# Patient Record
Sex: Male | Born: 1975 | Race: White | Hispanic: Yes | Marital: Married | Smoking: Never smoker
Health system: Southern US, Community
[De-identification: ages and names within clinical notes are randomized; demographics above are authoritative.]

## PROBLEM LIST (undated history)

## (undated) DIAGNOSIS — E785 Hyperlipidemia, unspecified: Secondary | ICD-10-CM

---

## 1999-02-06 ENCOUNTER — Encounter: Payer: Self-pay | Admitting: Emergency Medicine

## 1999-02-06 ENCOUNTER — Emergency Department (HOSPITAL_COMMUNITY): Admission: EM | Admit: 1999-02-06 | Discharge: 1999-02-06 | Payer: Self-pay | Admitting: Emergency Medicine

## 2005-02-03 ENCOUNTER — Emergency Department (HOSPITAL_COMMUNITY): Admission: EM | Admit: 2005-02-03 | Discharge: 2005-02-03 | Payer: Self-pay | Admitting: Family Medicine

## 2010-09-04 ENCOUNTER — Ambulatory Visit: Payer: Self-pay | Admitting: Internal Medicine

## 2011-09-24 ENCOUNTER — Emergency Department (HOSPITAL_COMMUNITY)
Admission: EM | Admit: 2011-09-24 | Discharge: 2011-09-24 | Disposition: A | Discharge status: Home / Self Care | Payer: Self-pay | Source: Home / Self Care

## 2011-09-24 DIAGNOSIS — B356 Tinea cruris: Secondary | ICD-10-CM

## 2011-09-24 MED ORDER — NYSTATIN-TRIAMCINOLONE 100000-0.1 UNIT/GM-% EX CREA: TOPICAL_CREAM | CUTANEOUS | Status: AC

## 2011-09-24 NOTE — ED Notes (Signed)
Concerned about irritated areas under arms and in groin area for past 2-3 days; small raised reddened areas noted

## 2011-09-24 NOTE — ED Provider Notes (Signed)
History     CSN: [id number] Arrival date & time: 09/24/2011  5:51 PM   None     Chief Complaint  Patient presents with  . Rash    (Consider location/radiation/quality/duration/timing/severity/associated sxs/prior treatment) Patient is a 35 y.o. male presenting with rash. The history is provided by the patient.  Rash  This is a new problem. The current episode started more than 2 days ago. The problem has not changed since onset.The problem is associated with nothing. The patient is experiencing no pain. Associated symptoms include itching. Pertinent negatives include no blisters and no weeping. Treatments tried: has been using Lotrimin x 2 days without improvement. The treatment provided no relief.    History reviewed. No pertinent past medical history.  History reviewed. No pertinent past surgical history.  History reviewed. No pertinent family history.  History  Substance Use Topics  . Smoking status: Never Smoker   . Smokeless tobacco: Not on file  . Alcohol Use: No      Review of Systems  Constitutional: Negative for fever and chills.  HENT: Negative for ear pain, congestion, sore throat and rhinorrhea.   Skin: Positive for itching and rash.    Allergies  Review of patient's allergies indicates no known allergies.  Home Medications   Current Outpatient Rx  Name Route Sig Dispense Refill  . NYSTATIN-TRIAMCINOLONE 100000-0.1 UNIT/GM-% EX CREA  Apply to affected area daily 15 g 0    BP 94/56  Pulse 83  Temp(Src) 98.2 F (36.8 C) (Oral)  Resp 16  SpO2 100%  Physical Exam  Nursing note and vitals reviewed. Constitutional: He appears well-developed and well-nourished. No distress.  HENT:  Head: Normocephalic and atraumatic.  Cardiovascular: Normal rate, regular rhythm and normal heart sounds.   Pulmonary/Chest: Effort normal and breath sounds normal. No respiratory distress.  Neurological: He is alert.  Skin: Skin is warm and dry. Rash noted.   Bilat anterior axilla annular lesions, erythematous dry border, central clearing - 1 Rt axilla, 2 Lt axilla, all < 1 cm.  Rt groin erythematous dry patch. Remainder of skin clear.   Psychiatric: He has a normal mood and affect.    ED Course  Procedures (including critical care time)  Labs Reviewed - No data to display No results found.   1. Tinea cruris       MDM          Melody Comas, PA 09/24/11 1904

## 2011-09-25 NOTE — ED Provider Notes (Signed)
Medical screening examination/treatment/procedure(s) were performed by non-physician practitioner and as supervising physician I was immediately available for consultation/collaboration.   [facility]; MD   Sharin Grave, MD 09/25/11 [phone]

## 2012-03-12 ENCOUNTER — Encounter (HOSPITAL_COMMUNITY): Payer: Self-pay | Admitting: *Deleted

## 2012-03-12 ENCOUNTER — Emergency Department (HOSPITAL_COMMUNITY)
Admission: EM | Admit: 2012-03-12 | Discharge: 2012-03-12 | Disposition: A | Discharge status: Home / Self Care | Payer: Self-pay | Source: Home / Self Care | Attending: Family Medicine | Admitting: Family Medicine

## 2012-03-12 DIAGNOSIS — J45909 Unspecified asthma, uncomplicated: Secondary | ICD-10-CM

## 2012-03-12 DIAGNOSIS — J069 Acute upper respiratory infection, unspecified: Secondary | ICD-10-CM

## 2012-03-12 MED ORDER — METHYLPREDNISOLONE ACETATE 40 MG/ML IJ SUSP
80.0000 mg | Freq: Once | INTRAMUSCULAR | Status: AC
  Administered 2012-03-12: 80 mg via INTRAMUSCULAR

## 2012-03-12 MED ORDER — METHYLPREDNISOLONE ACETATE 80 MG/ML IJ SUSP
INTRAMUSCULAR | Status: AC
  Filled 2012-03-12: qty 1

## 2012-03-12 MED ORDER — FLUTICASONE PROPIONATE 50 MCG/ACT NA SUSP: 1.0000 | Freq: Two times a day (BID) | NASAL | Status: DC

## 2012-03-12 MED ORDER — AZITHROMYCIN 250 MG PO TABS: ORAL_TABLET | ORAL | Status: AC

## 2012-03-12 MED ORDER — GUAIFENESIN-CODEINE 100-10 MG/5ML PO SYRP: 5.0000 mL | ORAL_SOLUTION | Freq: Three times a day (TID) | ORAL | Status: AC | PRN

## 2012-03-12 NOTE — ED Provider Notes (Signed)
History     CSN: [id number]  Arrival date & time 03/12/12  1424   First MD Initiated Contact with Patient 03/12/12 1500      Chief Complaint  Patient presents with  . Cough    (Consider location/radiation/quality/duration/timing/severity/associated sxs/prior treatment) Patient is a 36 y.o. male presenting with cough. The history is provided by the patient and the spouse.  Cough This is a new problem. The current episode started more than 2 days ago. The problem has been gradually worsening. The cough is productive of sputum. There has been no fever. Associated symptoms include rhinorrhea and sore throat. He is not a smoker.    History reviewed. No pertinent past medical history.  History reviewed. No pertinent past surgical history.  History reviewed. No pertinent family history.  History  Substance Use Topics  . Smoking status: Never Smoker   . Smokeless tobacco: Not on file  . Alcohol Use: No      Review of Systems  Constitutional: Negative.   HENT: Positive for congestion, sore throat, rhinorrhea and postnasal drip.   Respiratory: Positive for cough.     Allergies  Review of patient's allergies indicates no known allergies.  Home Medications   Current Outpatient Rx  Name Route Sig Dispense Refill  . LORATADINE 10 MG PO TABS Oral Take 10 mg by mouth daily.    . AZITHROMYCIN 250 MG PO TABS  Take as directed on pack 6 each 0  . FLUTICASONE PROPIONATE 50 MCG/ACT NA SUSP Nasal Place 1 spray into the nose 2 (two) times daily. 1 g 2  . GUAIFENESIN-CODEINE 100-10 MG/5ML PO SYRP Oral Take 5 mLs by mouth 3 (three) times daily as needed for cough. 120 mL 0  . NYSTATIN-TRIAMCINOLONE 100000-0.1 UNIT/GM-% EX CREA  Apply to affected area daily 15 g 0    BP 105/72  Pulse 92  Temp(Src) 98.1 F (36.7 C) (Oral)  Resp 16  SpO2 97%  Physical Exam  Nursing note and vitals reviewed. Constitutional: He is oriented to person, place, and time. He appears well-developed and  well-nourished.  HENT:  Head: Normocephalic.  Right Ear: External ear normal.  Left Ear: External ear normal.  Nose: Nose normal.  Mouth/Throat: Oropharynx is clear and moist.  Eyes: Pupils are equal, round, and reactive to light.  Neck: Normal range of motion. Neck supple.  Cardiovascular: Normal rate, regular rhythm, normal heart sounds and intact distal pulses.   Pulmonary/Chest: He has wheezes. He has rhonchi.  Lymphadenopathy:    He has no cervical adenopathy.  Neurological: He is alert and oriented to person, place, and time.  Skin: Skin is warm and dry.    ED Course  Procedures (including critical care time)  Labs Reviewed - No data to display No results found.   1. URI (upper respiratory infection)   2. Asthmatic bronchitis       MDM          Linna Hoff, MD 03/12/12 [phone]

## 2012-03-12 NOTE — ED Notes (Signed)
Pt  He  Had  Symptoms  Of  Congestion  Which progressed  Into  A  Productive  Cough  With  Greenish  Sputum  Production  He  Is  sitting  Upright on  Exam table      He   Is  Speaking in  Complete  sentances

## 2012-07-13 ENCOUNTER — Encounter (HOSPITAL_COMMUNITY): Payer: Self-pay

## 2012-07-13 ENCOUNTER — Emergency Department (INDEPENDENT_AMBULATORY_CARE_PROVIDER_SITE_OTHER)
Admission: EM | Admit: 2012-07-13 | Discharge: 2012-07-13 | Disposition: A | Discharge status: Home / Self Care | Payer: Self-pay | Source: Home / Self Care | Attending: Emergency Medicine | Admitting: Emergency Medicine

## 2012-07-13 DIAGNOSIS — S39012A Strain of muscle, fascia and tendon of lower back, initial encounter: Secondary | ICD-10-CM

## 2012-07-13 DIAGNOSIS — S335XXA Sprain of ligaments of lumbar spine, initial encounter: Secondary | ICD-10-CM

## 2012-07-13 LAB — POCT URINALYSIS DIP (DEVICE)
Ketones, ur: NEGATIVE mg/dL
Protein, ur: NEGATIVE mg/dL
Specific Gravity, Urine: >=1.03 (ref 1.005–1.030)
pH: 5.5 (ref 5.0–8.0)

## 2012-07-13 MED ORDER — IBUPROFEN 800 MG PO TABS: 800.0000 mg | ORAL_TABLET | Freq: Three times a day (TID) | ORAL | Status: AC

## 2012-07-13 MED ORDER — HYDROCODONE-ACETAMINOPHEN 5-325 MG PO TABS: 2.0000 | ORAL_TABLET | ORAL | Status: AC | PRN

## 2012-07-13 MED ORDER — CYCLOBENZAPRINE HCL 10 MG PO TABS: 10.0000 mg | ORAL_TABLET | Freq: Two times a day (BID) | ORAL | Status: AC | PRN

## 2012-07-13 NOTE — ED Provider Notes (Signed)
History     CSN: [id number]  Arrival date & time 07/13/12  1405   First MD Initiated Contact with Patient 07/13/12 1524      Chief Complaint  Patient presents with  . Back Pain    (Consider location/radiation/quality/duration/timing/severity/associated sxs/prior treatment) Patient is a 36 y.o. male presenting with back pain. The history is provided by the patient. No language interpreter was used.  Back Pain  This is a new problem. Episode onset: 3 days. The problem occurs constantly. The pain is present in the lumbar spine. The pain does not radiate. The pain is moderate. The symptoms are aggravated by twisting. The pain is the same all the time. Stiffness is present all day. He has tried nothing for the symptoms.  Pt complains of pain in his low back.  Pt reports lifting heavy objects but no back injury.  No hematuria  History reviewed. No pertinent past medical history.  History reviewed. No pertinent past surgical history.  No family history on file.  History  Substance Use Topics  . Smoking status: Never Smoker   . Smokeless tobacco: Not on file  . Alcohol Use: No      Review of Systems  Musculoskeletal: Positive for back pain.  All other systems reviewed and are negative.    Allergies  Review of patient's allergies indicates no known allergies.  Home Medications   Current Outpatient Rx  Name Route Sig Dispense Refill  . FLUTICASONE PROPIONATE 50 MCG/ACT NA SUSP Nasal Place 1 spray into the nose 2 (two) times daily. 1 g 2  . LORATADINE 10 MG PO TABS Oral Take 10 mg by mouth daily.    . NYSTATIN-TRIAMCINOLONE 100000-0.1 UNIT/GM-% EX CREA  Apply to affected area daily 15 g 0    BP 100/60  Pulse 85  Temp 98.2 F (36.8 C) (Oral)  Resp 18  SpO2 97%  Physical Exam  Nursing note and vitals reviewed. Constitutional: He is oriented to person, place, and time. He appears well-developed and well-nourished.  HENT:  Head: Normocephalic and atraumatic.  Right  Ear: External ear normal.  Left Ear: External ear normal.  Nose: Nose normal.  Mouth/Throat: Oropharynx is clear and moist.  Eyes: Conjunctivae and EOM are normal. Pupils are equal, round, and reactive to light.  Neck: Normal range of motion. Neck supple.  Cardiovascular: Normal rate and normal heart sounds.   Pulmonary/Chest: Effort normal.  Abdominal: Soft. Bowel sounds are normal.  Musculoskeletal: He exhibits tenderness.       Diffusely tender ls spine  Neurological: He is alert and oriented to person, place, and time.  Skin: Skin is warm.  Psychiatric: He has a normal mood and affect.    ED Course  Procedures (including critical care time)   Labs Reviewed  POCT URINALYSIS DIP (DEVICE)   No results found.   1. Lumbar strain       MDM  Ibuprofen flexeril and hydrocodone.          Lonia Skinner [place], [place] 07/13/12 1550  Lonia Skinner [place], [place] 07/13/12 1555

## 2012-07-13 NOTE — ED Notes (Signed)
C/o low back pain that radiates around to his abdomen for 3 days.  States started after lifting wood.  Denies hx of back problems.  States pain is worse with sitting.

## 2012-07-14 NOTE — ED Provider Notes (Signed)
Medical screening examination/treatment/procedure(s) were performed by resident physician or non-physician practitioner and as supervising physician I was immediately available for consultation/collaboration.   Jilliann Subramanian DOUGLAS MD.    Evangelyn Crouse D Terrall Bley, MD 07/14/12 2133 

## 2012-10-25 ENCOUNTER — Encounter (HOSPITAL_COMMUNITY): Payer: Self-pay

## 2012-10-25 ENCOUNTER — Emergency Department (INDEPENDENT_AMBULATORY_CARE_PROVIDER_SITE_OTHER)
Admission: EM | Admit: 2012-10-25 | Discharge: 2012-10-25 | Disposition: A | Discharge status: Home / Self Care | Payer: No Typology Code available for payment source | Source: Home / Self Care

## 2012-10-25 ENCOUNTER — Emergency Department (INDEPENDENT_AMBULATORY_CARE_PROVIDER_SITE_OTHER): Payer: No Typology Code available for payment source

## 2012-10-25 DIAGNOSIS — J111 Influenza due to unidentified influenza virus with other respiratory manifestations: Secondary | ICD-10-CM

## 2012-10-25 MED ORDER — OSELTAMIVIR PHOSPHATE 75 MG PO CAPS: 75.0000 mg | ORAL_CAPSULE | Freq: Two times a day (BID) | ORAL | Status: DC

## 2012-10-25 NOTE — ED Notes (Signed)
C/o cold like symptoms. Cough congestion

## 2012-10-25 NOTE — ED Provider Notes (Signed)
History     CSN: [id number]  Arrival date & time 10/25/12  1343   First MD Initiated Contact with Patient 10/25/12 1433      Chief Complaint  Patient presents with  . Cough     HPI - Patient is a young 36 year old Hispanic male without any significant medical issues, who presents with a one-day history of fever associated with arthralgias and myalgias. He also complains of intermittent headaches. He denies any shortness of breath. He claims he has a dry cough. Because of subjective and he has not measured them at home. He has been using over-the-counter medications like Tylenol since yesterday. He denies any nausea or vomiting. He denies any diarrhea.  History reviewed. No pertinent past medical history.  History reviewed. No pertinent past surgical history.  No family history on file.  History  Substance Use Topics  . Smoking status: Never Smoker   . Smokeless tobacco: Not on file  . Alcohol Use: No      Review of Systems + Fever + Nasal congestion and watery eyes + Myalgias and arthralgias + Intermittent headaches + Dry cough  No shortness of breath or chest pain No Nausea vomiting or diarrhea   Allergies  Review of patient's allergies indicates no known allergies.  Home Medications   Current Outpatient Rx  Name  Route  Sig  Dispense  Refill  . FLUTICASONE PROPIONATE 50 MCG/ACT NA SUSP   Nasal   Place 1 spray into the nose 2 (two) times daily.   1 g   2   . LORATADINE 10 MG PO TABS   Oral   Take 10 mg by mouth daily.         . OSELTAMIVIR PHOSPHATE 75 MG PO CAPS   Oral   Take 1 capsule (75 mg total) by mouth 2 (two) times daily.   10 capsule   0     BP 126/74  Pulse 85  Temp 98.7 F (37.1 C) (Oral)  Resp 20  SpO2 98%  Physical Exam General exam-awake and alert, appears slightly flushed but otherwise appears nontoxic. Watery eyes. Chest bilaterally clear to auscultation Cardiovascular-S1-S2 regular no murmurs heard Abdomen-soft  nontender nondistended. Bowel sounds are present Extremities no edema& Neurology-non focal Neck-supple with no meningeal signs detected. No cervical lymphadenopathy.  ED Course  Procedures (including critical care time)   Labs Reviewed  INFLUENZA PANEL BY PCR   No results found.   1. Flu syndrome    Patient presenting with fever, myalgias, runny nose and watery eyes. Also complaining of intermittent headaches. Hasn't had a flu vaccination yet. Symptoms are consistent with flulike symptoms, since the symptoms started yesterday, will place him on Tamiflu. We'll send out a PCR influenza and also check a chest x-ray. I've instructed the patient and the patient's wife, to seek immediate medical attention if he develops any sign of shortness of breath, persistent fevers, or if he develops any signs of dehydration. Patient has also been instructed to keep up with fluid intake as well.  He claims he has no medical problems, will bring him back in 2 weeks for further health maintenance issues and vaccination needs once he is over with his acute illness.   MDM  Return in 2 weeks       Maretta Bees, MD 10/25/12 1453

## 2012-10-26 LAB — INFLUENZA PANEL BY PCR (TYPE A & B): Influenza A By PCR: POSITIVE — AB

## 2012-10-26 NOTE — ED Notes (Signed)
Spoke with patient this evening of flu results positive.  Patient stated he filled the prescription yesterday and started taking the tamiflu.

## 2013-09-19 ENCOUNTER — Ambulatory Visit: Payer: No Typology Code available for payment source

## 2014-01-07 IMAGING — CR DG CHEST 2V
2 series · 2 of 2 positions shown · non-contrast
Comparison: None.

CLINICAL DATA: Fever and cough

CHEST - 2 VIEW

[view not recorded (1 of 2)]
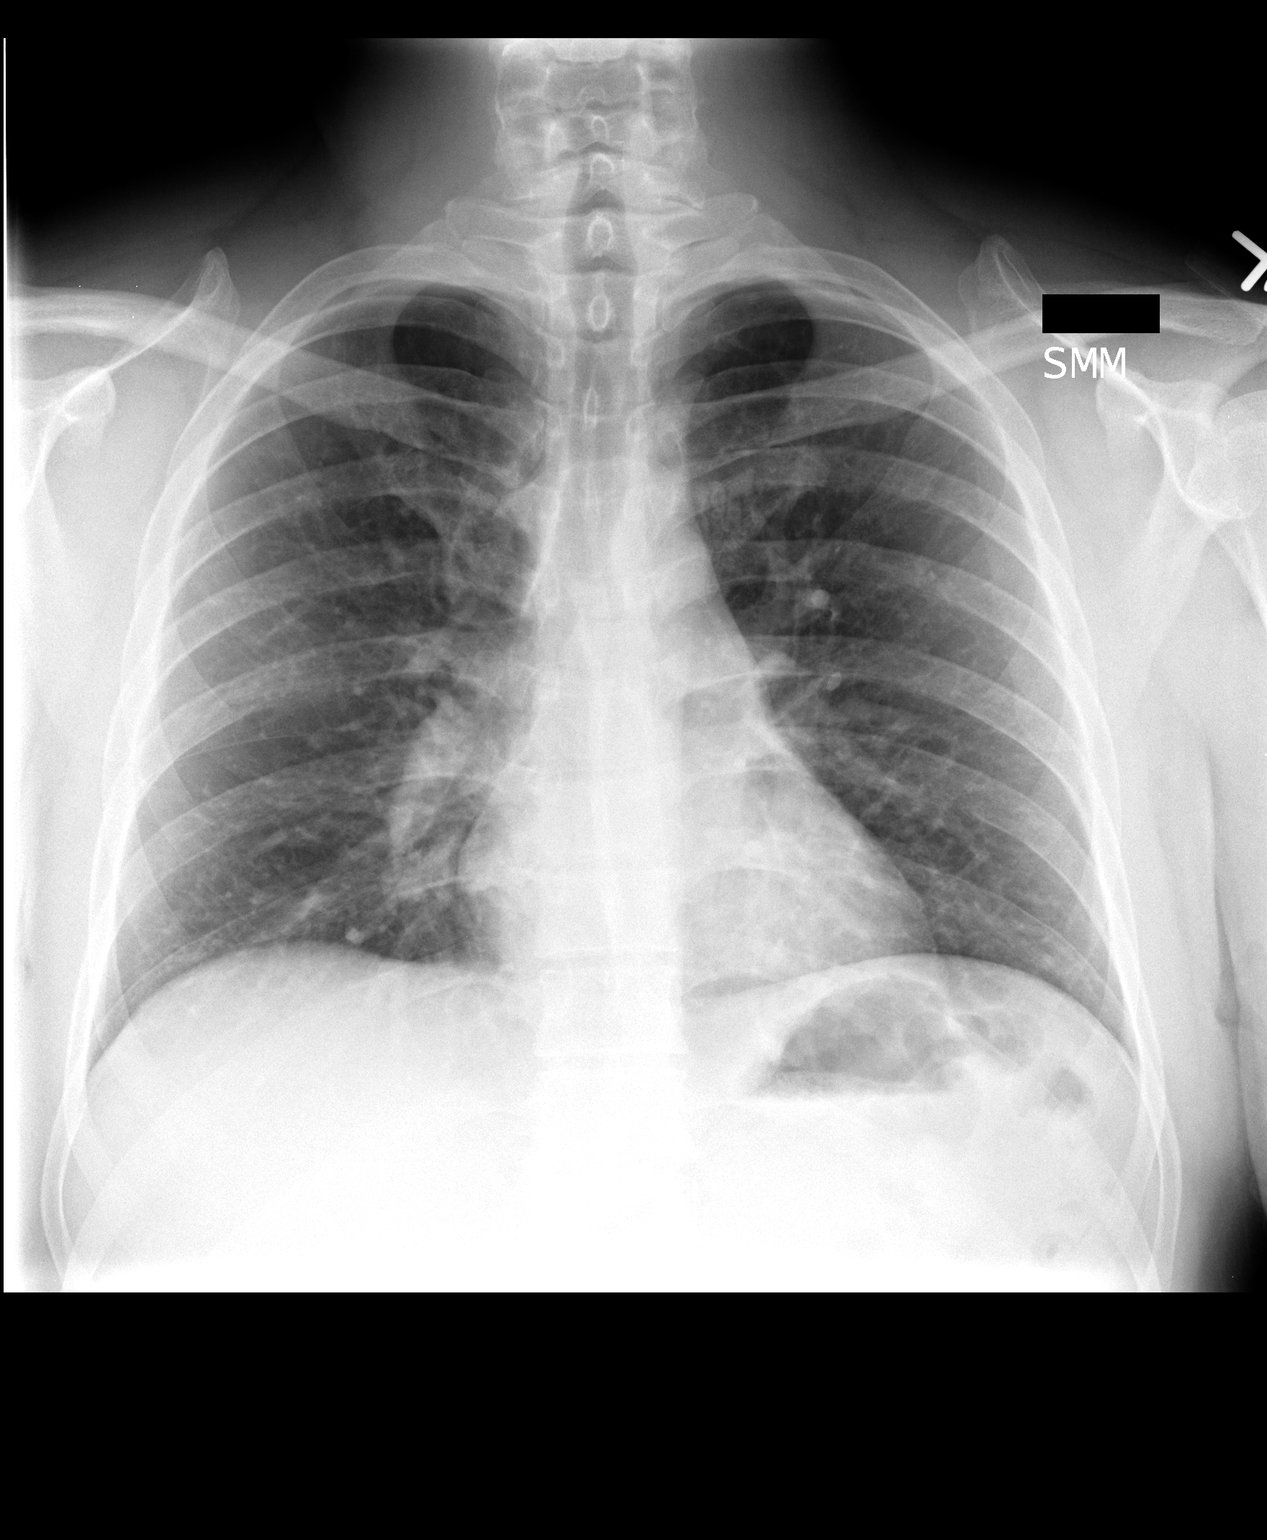

[view not recorded (2 of 2)]
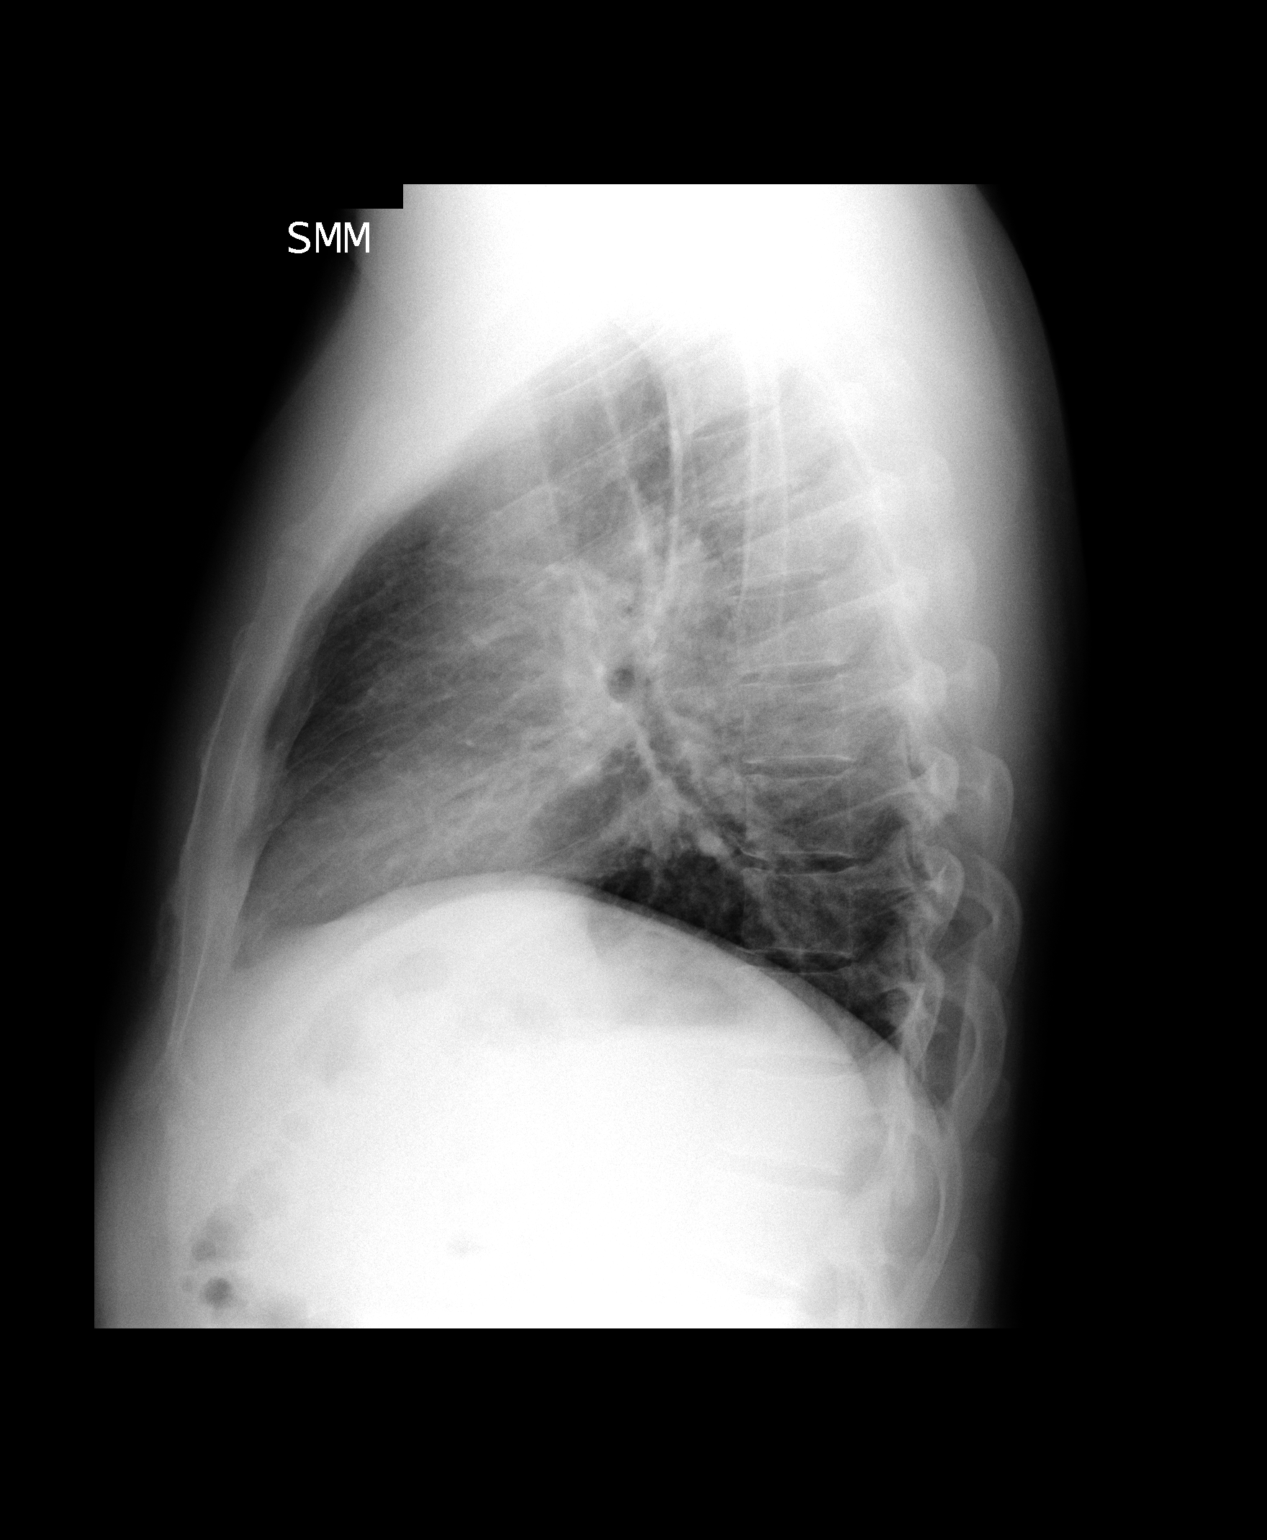

[2 of 2 positions shown; findings below may reference images not displayed]

FINDINGS: Lungs clear.  Heart size and pulmonary vascularity are
normal.  No adenopathy.  No bone lesions.
IMPRESSION: Lungs clear.

## 2014-05-03 ENCOUNTER — Ambulatory Visit: Payer: No Typology Code available for payment source | Attending: Internal Medicine

## 2014-06-15 ENCOUNTER — Ambulatory Visit: Payer: No Typology Code available for payment source | Admitting: Family Medicine

## 2014-07-14 ENCOUNTER — Ambulatory Visit (INDEPENDENT_AMBULATORY_CARE_PROVIDER_SITE_OTHER): Payer: No Typology Code available for payment source | Admitting: Family Medicine

## 2014-07-14 ENCOUNTER — Encounter: Payer: Self-pay | Admitting: Family Medicine

## 2014-07-14 VITALS — BP 111/77 | HR 85 | Temp 98.3°F | Resp 16 | Ht 63.0 in | Wt 170.0 lb

## 2014-07-14 DIAGNOSIS — Z8639 Personal history of other endocrine, nutritional and metabolic disease: Secondary | ICD-10-CM

## 2014-07-14 DIAGNOSIS — R5383 Other fatigue: Principal | ICD-10-CM

## 2014-07-14 DIAGNOSIS — Z862 Personal history of diseases of the blood and blood-forming organs and certain disorders involving the immune mechanism: Secondary | ICD-10-CM

## 2014-07-14 DIAGNOSIS — B351 Tinea unguium: Secondary | ICD-10-CM

## 2014-07-14 DIAGNOSIS — R5381 Other malaise: Secondary | ICD-10-CM

## 2014-07-14 DIAGNOSIS — Z87898 Personal history of other specified conditions: Secondary | ICD-10-CM

## 2014-07-14 DIAGNOSIS — R0981 Nasal congestion: Secondary | ICD-10-CM

## 2014-07-14 DIAGNOSIS — R059 Cough, unspecified: Secondary | ICD-10-CM

## 2014-07-14 DIAGNOSIS — R05 Cough: Secondary | ICD-10-CM

## 2014-07-14 DIAGNOSIS — J3489 Other specified disorders of nose and nasal sinuses: Secondary | ICD-10-CM

## 2014-07-14 MED ORDER — CLOTRIMAZOLE-BETAMETHASONE 1-0.05 % EX CREA: 1.0000 "application " | TOPICAL_CREAM | Freq: Two times a day (BID) | CUTANEOUS | Status: DC

## 2014-07-14 MED ORDER — FLUTICASONE PROPIONATE 50 MCG/ACT NA SUSP: 2.0000 | Freq: Every day | NASAL | Status: DC

## 2014-07-14 MED ORDER — DM-GUAIFENESIN ER 30-600 MG PO TB12: 1.0000 | ORAL_TABLET | Freq: Two times a day (BID) | ORAL | Status: DC

## 2014-07-14 NOTE — Progress Notes (Signed)
Subjective:    Patient ID: Warren Owens, male    DOB: [birth year], 38 y.o.   MRN: [id number]  HPI  Mr. Danyl Deems presents to establish care. He reports that he typically goes to [facility] for all primary care needs.    Patient is complaining of coughing, runny nose, and chest congestion for 4 days. He reports that symptoms are worsened when lying down at night under air conditioning.  He denies fever, chills, shortness of breath or current chest pains. He states that he has not attempted over the counter interventions to alleviate symptoms.    Reports that he had chest palpitations 1 week ago. He reported periodic pains to chest 1 week ago. He states that he has had chest pains before that quickly dissipated. Mr. Marjorie Smolder denies a family history of heart disease, shortness of breath, weakness, tachypnea, edema, nausea, vomiting or diarrhea.   No past medical history on file. Review of Systems  Constitutional: Negative for fever and fatigue.  HENT: Positive for postnasal drip and sinus pressure. Negative for ear pain, tinnitus and trouble swallowing.        Periodic sinus pressure  Eyes: Negative.   Respiratory: Negative.   Cardiovascular: Negative for chest pain, palpitations and leg swelling.  Endocrine: Negative.   Skin: Negative.   Allergic/Immunologic: Positive for environmental allergies.  Neurological: Negative.   Hematological: Negative.   Psychiatric/Behavioral: Negative.        Objective:   Physical Exam  Constitutional: He is oriented to person, place, and time. Vital signs are normal. He appears well-developed and well-nourished.  HENT:  Head: Normocephalic and atraumatic.  Right Ear: External ear normal.  Nose: Mucosal edema present. Right sinus exhibits no maxillary sinus tenderness and no frontal sinus tenderness. Left sinus exhibits no maxillary sinus tenderness and no frontal sinus tenderness.  Mouth/Throat: Uvula is midline. Normal  dentition. Dental caries present. Oropharyngeal exudate present.  Eyes: Conjunctivae are normal. Pupils are equal, round, and reactive to light.  Neck: Normal range of motion. Neck supple.  Cardiovascular: Normal rate, regular rhythm, normal heart sounds and intact distal pulses.   Pulmonary/Chest: Effort normal and breath sounds normal.  Abdominal: Soft. Bowel sounds are normal.  Musculoskeletal: Normal range of motion.  Neurological: He is alert and oriented to person, place, and time.  Skin: Skin is warm, dry and intact. Nails show no clubbing.     Psychiatric: He has a normal mood and affect. His speech is normal and behavior is normal. Judgment and thought content normal. Cognition and memory are normal.         BP 111/77  Pulse 85  Temp(Src) 98.3 F (36.8 C) (Oral)  Resp 16  Ht  (1.6 m)  Wt 170 lb (77.111 kg)  BMI 30.12 kg/m2 Assessment & Plan:   1. Other malaise and fatigue  He reports that he is extremely tired on most days. Recommend that he start a balanced diet and exercise regimen - CBC with Differential; Future  2. History of high cholesterol  - Lipid Panel; Future  3. History of chest pain - COMPLETE METABOLIC PANEL WITH GFR; Future - EKG 12-Lead-Normal sinus rhythm on review  4. Nasal congestion - fluticasone (FLONASE) 50 MCG/ACT nasal spray; Place 2 sprays into both nostrils daily.  Dispense: 16 g; Refill: 0  5. Cough  - dextromethorphan-guaiFENesin (MUCINEX DM) 30-600 MG per 12 hr tablet; Take 1 tablet by mouth 2 (two) times daily.  Dispense: 20 tablet; Refill: 0  6. Onychomycosis of left great toe - Recommend that he remove shoes and socks while at home. Continue to wear sneakers wash them in bleach. Also, wash and dry feet thoroughly.   clotrimazole-betamethasone (LOTRISONE) cream; Apply 1 application topically 2 (two) times daily.  Dispense: 30 g; Refill: 0     Vaccinations: Patient reports that he has not had flu vaccination, he is not  feeling well today, will postpone until next visit.   Last tetanus vaccination:  3 years ago.   Massie Maroon, FNP

## 2014-07-14 NOTE — Patient Instructions (Signed)
Onycomycosis Wash socks and white tennis shoes with bleach Remove shoes while at home NAIL CARE - Thick, dystrophic nails due to onychomycosis can make it difficult for patients to trim nails and may lead to pain during ambulation. The removal of hyperkeratotic nail debris may help to ameliorate these symptoms.  Topical urea is a keratolytic agent that we have found useful for this purpose in patients who forgo antifungal treatment and as an adjunctive measure in those who proceed with treatment [97,98]. For patients with very thick, dystrophic nails, we utilize the following nighttime regimen: ?Apply a generous, protective layer of Lotrisone cream  to the periungual skin ?Apply a thick layer of urea 40% cream or ointment to the nail ?Occlude the nail with a bandage or tape and leave on overnight ?Wash the urea off with soap and water or diluted bleach and water  in the morning ?Repeat this procedure nightly until the nail softens and can be easily clipped or debrided  Reapplication of urea without occlusion on an as-needed basis can be used to maintain improvement in these patients. Treatment without occlusion also is often sufficient for reducing nail hyperkeratosis in patients who present with less severe nail thickening

## 2014-07-18 ENCOUNTER — Other Ambulatory Visit: Payer: No Typology Code available for payment source

## 2014-07-18 DIAGNOSIS — Z87898 Personal history of other specified conditions: Secondary | ICD-10-CM

## 2014-07-18 DIAGNOSIS — R5381 Other malaise: Secondary | ICD-10-CM

## 2014-07-18 DIAGNOSIS — Z8639 Personal history of other endocrine, nutritional and metabolic disease: Secondary | ICD-10-CM

## 2014-07-18 DIAGNOSIS — R5383 Other fatigue: Secondary | ICD-10-CM

## 2014-07-18 LAB — COMPLETE METABOLIC PANEL WITH GFR
ALBUMIN: 4.2 g/dL (ref 3.5–5.2)
ALT: 33 U/L (ref 0–53)
AST: 25 U/L (ref 0–37)
Alkaline Phosphatase: 91 U/L (ref 39–117)
BUN: 13 mg/dL (ref 6–23)
CALCIUM: 9.3 mg/dL (ref 8.4–10.5)
CHLORIDE: 103 meq/L (ref 96–112)
CO2: 25 meq/L (ref 19–32)
Creat: 0.88 mg/dL (ref 0.50–1.35)
GFR, Est Non African American: >89 mL/min
Glucose, Bld: 99 mg/dL (ref 70–99)
POTASSIUM: 4.2 meq/L (ref 3.5–5.3)
Sodium: 135 mEq/L (ref 135–145)
Total Bilirubin: 0.5 mg/dL (ref 0.2–1.2)
Total Protein: 7.1 g/dL (ref 6.0–8.3)

## 2014-07-18 LAB — CBC WITH DIFFERENTIAL/PLATELET
BASOS ABS: 0 10*3/uL (ref 0.0–0.1)
Basophils Relative: 0 % (ref 0–1)
Eosinophils Absolute: 0.3 10*3/uL (ref 0.0–0.7)
Eosinophils Relative: 4 % (ref 0–5)
HEMATOCRIT: 43.4 % (ref 39.0–52.0)
Hemoglobin: 15 g/dL (ref 13.0–17.0)
LYMPHS ABS: 2.8 10*3/uL (ref 0.7–4.0)
LYMPHS PCT: 36 % (ref 12–46)
MCH: 30.6 pg (ref 26.0–34.0)
MCHC: 34.6 g/dL (ref 30.0–36.0)
MCV: 88.6 fL (ref 78.0–100.0)
MONO ABS: 0.6 10*3/uL (ref 0.1–1.0)
Monocytes Relative: 8 % (ref 3–12)
NEUTROS ABS: 4 10*3/uL (ref 1.7–7.7)
Neutrophils Relative %: 52 % (ref 43–77)
PLATELETS: 312 10*3/uL (ref 150–400)
RBC: 4.9 MIL/uL (ref 4.22–5.81)
RDW: 13.2 % (ref 11.5–15.5)
WBC: 7.7 10*3/uL (ref 4.0–10.5)

## 2014-07-18 LAB — LIPID PANEL
Cholesterol: 181 mg/dL (ref 0–200)
HDL: 37 mg/dL — AB (ref >39)
LDL CALC: 94 mg/dL (ref 0–99)
Total CHOL/HDL Ratio: 4.9 Ratio
Triglycerides: 251 mg/dL — ABNORMAL HIGH (ref <150)
VLDL: 50 mg/dL — ABNORMAL HIGH (ref 0–40)

## 2014-07-19 ENCOUNTER — Telehealth: Payer: Self-pay | Admitting: Family Medicine

## 2014-07-19 ENCOUNTER — Other Ambulatory Visit: Payer: No Typology Code available for payment source

## 2014-07-19 NOTE — Telephone Encounter (Signed)
Reviewed laboratory values, including lipid panel. ASCVD risk is 3.2. Recommend that patient starts a low fat diet divided over 6 small meals, exercise 3 days per week and add 6-8 glasses of water. Pt to follow up for CPE with Dr. Ashley Royalty as scheduled.

## 2014-07-21 ENCOUNTER — Telehealth: Payer: Self-pay

## 2014-07-21 NOTE — Telephone Encounter (Signed)
Left message to return call regarding lab results. Thanks!

## 2014-07-24 NOTE — Telephone Encounter (Signed)
Called and spoke with patient advised him to eat 6 small lowfat meals a day, drink 6 to 8 glasses of water per day and to exersie 3 times per week; also advised him to keep up coming cpe appt with dr. Ashley Royalty.

## 2014-10-12 ENCOUNTER — Encounter: Payer: Self-pay | Admitting: Family Medicine

## 2014-10-12 ENCOUNTER — Ambulatory Visit (INDEPENDENT_AMBULATORY_CARE_PROVIDER_SITE_OTHER): Payer: No Typology Code available for payment source | Admitting: Family Medicine

## 2014-10-12 VITALS — BP 113/71 | HR 92 | Temp 97.9°F | Resp 14 | Ht 63.0 in | Wt 170.0 lb

## 2014-10-12 DIAGNOSIS — Z23 Encounter for immunization: Secondary | ICD-10-CM

## 2014-10-12 DIAGNOSIS — E785 Hyperlipidemia, unspecified: Secondary | ICD-10-CM

## 2014-10-12 DIAGNOSIS — Z0000 Encounter for general adult medical examination without abnormal findings: Secondary | ICD-10-CM

## 2014-10-12 LAB — LIPID PANEL
CHOL/HDL RATIO: 4.4 ratio
Cholesterol: 191 mg/dL (ref 0–200)
HDL: 43 mg/dL (ref >39)
LDL CALC: 105 mg/dL — AB (ref 0–99)
TRIGLYCERIDES: 215 mg/dL — AB (ref <150)
VLDL: 43 mg/dL — AB (ref 0–40)

## 2014-10-12 LAB — TSH: TSH: 1.122 u[IU]/mL (ref 0.350–4.500)

## 2014-10-12 NOTE — Progress Notes (Signed)
   Subjective:    Patient ID: Warren Owens, male    DOB: Nov 23, 1975, 38 y.o.   MRN: [id number]  HPI  [name] presents for a complete physical examination. He reports that he feels well and is without current complaint. He maintains that he has been watching his diet but has not been exercising on a regular basis. [name] is sexually active and does not perform monthly self-testicular examination.  Patient does not wear sunscreen, has a yearly eye exam, and has not been to a dentist in several years.    Review of Systems  Constitutional: Negative.   HENT: Negative.  Negative for sore throat and tinnitus.   Eyes: Negative.   Respiratory: Negative.   Cardiovascular: Negative.   Gastrointestinal: Negative.   Endocrine: Negative.   Genitourinary: Negative.   Musculoskeletal: Negative.   Skin: Negative.   Allergic/Immunologic: Negative.   Neurological: Negative.  Negative for dizziness.  Hematological: Negative.   Psychiatric/Behavioral: Negative.  Negative for sleep disturbance.      Objective:   Physical Exam  Constitutional: He is oriented to person, place, and time. He appears well-developed and well-nourished.  HENT:  Head: Normocephalic and atraumatic.  Right Ear: External ear normal.  Left Ear: External ear normal.  Mouth/Throat: Oropharynx is clear and moist.  Eyes: Conjunctivae, EOM and lids are normal. Pupils are equal, round, and reactive to light.  Neck: Trachea normal and normal range of motion. Neck supple.  Cardiovascular: Normal rate, regular rhythm, normal heart sounds and intact distal pulses.   Pulmonary/Chest: Effort normal and breath sounds normal. He has no decreased breath sounds. He has no wheezes.  Abdominal: Soft. Bowel sounds are normal. There is no tenderness. Hernia confirmed negative in the right inguinal area and confirmed negative in the left inguinal area.  Genitourinary: Rectum normal, prostate normal, testes normal and penis  normal. Guaiac negative stool. Prostate is not enlarged and not tender. Right testis shows no mass. Left testis shows no mass. Uncircumcised.  Musculoskeletal: Normal range of motion.  Lymphadenopathy:       Head (right side): No submental and no submandibular adenopathy present.       Head (left side): No submental and no submandibular adenopathy present.  Neurological: He is alert and oriented to person, place, and time. He has normal reflexes.  Skin: Skin is warm and dry.  Psychiatric: He has a normal mood and affect. His behavior is normal. Judgment and thought content normal.         BP 113/71 mmHg  Pulse 92  Temp(Src) 97.9 F (36.6 C) (Oral)  Resp 14  Ht 5\' 3"  (1.6 m)  Wt 170 lb (77.111 kg)  BMI 30.12 kg/m2 Assessment & Plan:    1. Visit for annual health examination - TSH - Hemoglobin A1c - Lipid Panel - Urinalysis, Complete -Recommend wearing sunscreen and checking skin monthly -Recommend monthly self-testicular examinations -Recommend a low fat, low carbohydrate diet.  2. Dyslipidemia Previous ASCVD is -1.3%. Patient is currently watching his diet and exercising.  3. Need for immunization against influenza - Flu Vaccine QUAD 36+ mos IM (Fluarix)  Hollis,Lachina M, FNP

## 2014-10-13 ENCOUNTER — Telehealth: Payer: Self-pay | Admitting: Family Medicine

## 2014-10-13 DIAGNOSIS — E785 Hyperlipidemia, unspecified: Secondary | ICD-10-CM

## 2014-10-13 DIAGNOSIS — R7303 Prediabetes: Secondary | ICD-10-CM | POA: Insufficient documentation

## 2014-10-13 LAB — URINALYSIS, COMPLETE
BACTERIA UA: NONE SEEN
Bilirubin Urine: NEGATIVE
CASTS: NONE SEEN
Crystals: NONE SEEN
Glucose, UA: NEGATIVE mg/dL
HGB URINE DIPSTICK: NEGATIVE
KETONES UR: NEGATIVE mg/dL
Leukocytes, UA: NEGATIVE
NITRITE: NEGATIVE
PROTEIN: NEGATIVE mg/dL
Specific Gravity, Urine: 1.019 (ref 1.005–1.030)
Squamous Epithelial / LPF: NONE SEEN
UROBILINOGEN UA: 0.2 mg/dL (ref 0.0–1.0)
pH: 5.5 (ref 5.0–8.0)

## 2014-10-13 LAB — HEMOGLOBIN A1C
Hgb A1c MFr Bld: 5.8 % — ABNORMAL HIGH (ref <5.7)
Mean Plasma Glucose: 120 mg/dL — ABNORMAL HIGH (ref <117)

## 2014-10-13 NOTE — Telephone Encounter (Signed)
Reviewed labs from 10/12/2014. Patient is pre-diabetic, discussed starting a low fat, low carbohydrate diet during appointment. Also, triglycerides and LDL cholesterol remain mildly elevated. Patient is to continue diet, exercise, and increase water intake over the next 6 months. He will follow-up with Dr. Ashley RoyaltyMatthews in 6 months for pre-diabetes and dyslipidemia.     Warren MaroonHollis,Maykayla Highley M, FNP

## 2014-11-17 ENCOUNTER — Ambulatory Visit: Payer: No Typology Code available for payment source

## 2015-01-09 ENCOUNTER — Ambulatory Visit: Payer: No Typology Code available for payment source | Admitting: Family Medicine

## 2015-01-10 ENCOUNTER — Ambulatory Visit (INDEPENDENT_AMBULATORY_CARE_PROVIDER_SITE_OTHER): Payer: No Typology Code available for payment source | Admitting: Family Medicine

## 2015-01-10 VITALS — BP 106/70 | HR 80 | Temp 98.3°F | Resp 16 | Ht 63.0 in | Wt 165.0 lb

## 2015-01-10 DIAGNOSIS — L309 Dermatitis, unspecified: Secondary | ICD-10-CM

## 2015-01-10 DIAGNOSIS — IMO0002 Reserved for concepts with insufficient information to code with codable children: Secondary | ICD-10-CM

## 2015-01-10 DIAGNOSIS — J309 Allergic rhinitis, unspecified: Secondary | ICD-10-CM

## 2015-01-10 DIAGNOSIS — N508 Other specified disorders of male genital organs: Secondary | ICD-10-CM

## 2015-01-10 MED ORDER — TRIAMCINOLONE ACETONIDE 0.1 % EX CREA: 1.0000 "application " | TOPICAL_CREAM | Freq: Two times a day (BID) | CUTANEOUS | Status: DC

## 2015-01-10 MED ORDER — CETIRIZINE HCL 10 MG PO TABS: 10.0000 mg | ORAL_TABLET | Freq: Every day | ORAL | Status: DC

## 2015-01-10 NOTE — Progress Notes (Signed)
Subjective:    Patient ID: Warren Owens, male    DOB: [birth year], 39 y.o.   MRN: [id number]  HPI Warren Owens, a 39 year old male presents with a complaint of testicular pain. He states that he was moving furniture on 01/09/2015 and felt mild pain in his left testicle. He states that he pain began suddenly and dissipated quickly. He is not having pain or discomfort at present. He was concerned of a possible hernia and is here for evaluation.   Warren Owens is also complaining of allergic rhinitis.  Patient's symptoms include itchy eyes, itchy nose, sinus pressure and watery eyes. These symptoms are seasonal. Current triggers include exposure to pollens, dust, mold and weather changes. The patient has been suffering from these symptoms several days. The patient has not attempted any OTC interventions to alleviate symptoms.   Patient complains of a dry rash primarily to abdominal folds. Symptoms began several weeks ago. Patient describes the rash as dry and itchy. He states that he has been using lotion without relief. He denies a history of eczema. He reports a history of seasonal allergies. He states that he has not changed soaps, laundry detergent, lotions, and does not have pets.    History   Social History  . Marital Status: Married    Spouse Name: N/A  . Number of Children: N/A  . Years of Education: N/A   Occupational History  . Not on file.   Social History Main Topics  . Smoking status: Never Smoker   . Smokeless tobacco: Not on file  . Alcohol Use: No  . Drug Use: No  . Sexual Activity: Not on file   Other Topics Concern  . Not on file   Social History Narrative  No Known Allergies   Review of Systems  Constitutional: Negative for fever and fatigue.  HENT: Positive for postnasal drip, sinus pressure and sneezing.   Eyes: Positive for redness and itching.  Respiratory: Positive for cough.   Cardiovascular: Negative.   Gastrointestinal: Negative.    Endocrine: Negative.  Negative for polydipsia, polyphagia and polyuria.  Genitourinary: Negative for urgency, discharge, penile swelling, scrotal swelling and penile pain.  Skin: Positive for rash.  Allergic/Immunologic: Positive for environmental allergies.  Neurological: Negative.   Hematological: Negative.   Psychiatric/Behavioral: Negative.        Objective:   Physical Exam  Constitutional: He appears well-nourished.  HENT:  Head: Normocephalic and atraumatic.  Right Ear: Hearing, tympanic membrane, external ear and ear canal normal.  Left Ear: Hearing, tympanic membrane, external ear and ear canal normal.  Nose: No rhinorrhea or sinus tenderness. Right sinus exhibits no maxillary sinus tenderness and no frontal sinus tenderness. Left sinus exhibits no maxillary sinus tenderness and no frontal sinus tenderness.  Mouth/Throat: Mucous membranes are pale. Posterior oropharyngeal erythema present.  Abdominal: Hernia confirmed negative in the right inguinal area and confirmed negative in the left inguinal area.  Genitourinary: Testes normal. Cremasteric reflex is present. Right testis shows no mass and no tenderness. Left testis shows no mass, no swelling and no tenderness. Uncircumcised. No penile erythema. No discharge found.  Lymphadenopathy:       Right: No inguinal adenopathy present.       Left: No inguinal adenopathy present.  Skin: Rash noted. Rash is macular.  Dry, erythematous, flat, macular rash to right primarily to right abdominal fold and bilateral posterior forearms.          BP 106/70 mmHg  Pulse 80  Temp(Src)  98.3 F (36.8 C) (Oral)  Resp 16  Ht 5\' 3"  (1.6 m)  Wt 165 lb (74.844 kg)  BMI 29.24 kg/m2 Assessment & Plan:   1. Allergic rhinitis, unspecified allergic rhinitis type He states that allergies are primarily triggered by changes in weather, dust, and pollens. Discussed avoiding allergens at length. Will add antihistamine to daily regimen; recommend  that he take Cetirizine at night.    - cetirizine (ZYRTEC) 10 MG tablet; Take 1 tablet (10 mg total) by mouth daily.  Dispense: 30 tablet; Refill: 3  2. Dermatitis No factors identified as cause of dermatitis. Warren Owens states that he has not changed soap, lotions, or laundry detergent. He reports that he does not have pets. Recommended Dove unscented soap, hypoallergenic laundry detergent, and refrain from scratching dry areas.   - triamcinolone cream (KENALOG) 0.1 %; Apply 1 application topically 2 (two) times daily.  Dispense: 30 g; Refill: 0   3. Testicular/scrotal pain Evaluated Warren Owens for possible hernia, did not elicit pain on testicular/scrotal examination. No signs of hernia present on examination.     RTC: Follow up as previously scheduled Massie Maroon, FNP

## 2015-01-10 NOTE — Patient Instructions (Addendum)
High Cholesterol High cholesterol refers to having a high level of cholesterol in your blood. Cholesterol is a white, waxy, fat-like protein that your body needs in small amounts. Your liver makes all the cholesterol you need. Excess cholesterol comes from the food you eat. Cholesterol travels in your bloodstream through your blood vessels. If you have high cholesterol, deposits (plaque) may build up on the walls of your blood vessels. This makes the arteries narrower and stiffer. Plaque increases your risk of heart attack and stroke. Work with your health care provider to keep your cholesterol levels in a healthy range. RISK FACTORS Several things can make you more likely to have high cholesterol. These include:   Eating foods high in animal fat (saturated fat) or cholesterol.  Being overweight.  Not getting enough exercise.  Having a family history of high cholesterol. SIGNS AND SYMPTOMS High cholesterol does not cause symptoms. DIAGNOSIS  Your health care provider can do a blood test to check whether you have high cholesterol. If you are older than 20, your health care provider may check your cholesterol every 4-6 years. You may be checked more often if you already have high cholesterol or other risk factors for heart disease. The blood test for cholesterol measures the following:  Bad cholesterol (LDL cholesterol). This is the type of cholesterol that causes heart disease. This number should be less than 100.  Good cholesterol (HDL cholesterol). This type helps protect against heart disease. A healthy level of HDL cholesterol is 60 or higher.  Total cholesterol. This is the combined number of LDL cholesterol and HDL cholesterol. A healthy number is less than 200. TREATMENT  High cholesterol can be treated with diet changes, lifestyle changes, and medicine.   Diet changes may include eating more whole grains, fruits, vegetables, nuts, and fish. You may also have to cut back on red meat  and foods with a lot of added sugar.  Lifestyle changes may include getting at least 40 minutes of aerobic exercise three times a week. Aerobic exercises include walking, biking, and swimming. Aerobic exercise along with a healthy diet can help you maintain a healthy weight. Lifestyle changes may also include quitting smoking.  If diet and lifestyle changes are not enough to lower your cholesterol, your health care provider may prescribe a statin medicine. This medicine has been shown to lower cholesterol and also lower the risk of heart disease. HOME CARE INSTRUCTIONS  Only take over-the-counter or prescription medicines as directed by your health care provider.   Follow a healthy diet as directed by your health care provider. For instance:   Eat chicken (without skin), fish, veal, shellfish, ground turkey breast, and round or loin cuts of red meat.  Do not eat fried foods and fatty meats, such as hot dogs and salami.   Eat plenty of fruits, such as apples.   Eat plenty of vegetables, such as broccoli, potatoes, and carrots.   Eat beans, peas, and lentils.   Eat grains, such as barley, rice, couscous, and bulgur wheat.   Eat pasta without cream sauces.   Use skim or nonfat milk and low-fat or nonfat yogurt and cheeses. Do not eat or drink whole milk, cream, ice cream, egg yolks, and hard cheeses.   Do not eat stick margarine or tub margarines that contain trans fats (also called partially hydrogenated oils).   Do not eat cakes, cookies, crackers, or other baked goods that contain trans fats.   Do not eat saturated tropical oils, such as   coconut and palm oil.   Exercise as directed by your health care provider. Increase your activity level with activities such as gardening or walking.   Keep all follow-up appointments.  SEEK MEDICAL CARE IF:  You are struggling to maintain a healthy diet or weight.  You need help starting an exercise program.  You need help to  stop smoking. SEEK IMMEDIATE MEDICAL CARE IF:  You have chest pain.  You have trouble breathing. Document Released: 10/27/2005 Document Revised: 03/13/2014 Document Reviewed: 08/19/2013 [facility] Patient Information 2015 [place], [place]. This information is not intended to replace advice given to you by your health care provider. Make sure you discuss any questions you have with your health care provider. Allergic Rhinitis Allergic rhinitis is when the mucous membranes in the nose respond to allergens. Allergens are particles in the air that cause your body to have an allergic reaction. This causes you to release allergic antibodies. Through a chain of events, these eventually cause you to release histamine into the blood stream. Although meant to protect the body, it is this release of histamine that causes your discomfort, such as frequent sneezing, congestion, and an itchy, runny nose.  CAUSES  Seasonal allergic rhinitis (hay fever) is caused by pollen allergens that may come from grasses, trees, and weeds. Year-round allergic rhinitis (perennial allergic rhinitis) is caused by allergens such as house dust mites, pet dander, and mold spores.  SYMPTOMS   Nasal stuffiness (congestion).  Itchy, runny nose with sneezing and tearing of the eyes. DIAGNOSIS  Your health care provider can help you determine the allergen or allergens that trigger your symptoms. If you and your health care provider are unable to determine the allergen, skin or blood testing may be used. TREATMENT  Allergic rhinitis does not have a cure, but it can be controlled by:  Medicines and allergy shots (immunotherapy).  Avoiding the allergen. Hay fever may often be treated with antihistamines in pill or nasal spray forms. Antihistamines block the effects of histamine. There are over-the-counter medicines that may help with nasal congestion and swelling around the eyes. Check with your health care provider before taking or  giving this medicine.  If avoiding the allergen or the medicine prescribed do not work, there are many new medicines your health care provider can prescribe. Stronger medicine may be used if initial measures are ineffective. Desensitizing injections can be used if medicine and avoidance does not work. Desensitization is when a patient is given ongoing shots until the body becomes less sensitive to the allergen. Make sure you follow up with your health care provider if problems continue. HOME CARE INSTRUCTIONS It is not possible to completely avoid allergens, but you can reduce your symptoms by taking steps to limit your exposure to them. It helps to know exactly what you are allergic to so that you can avoid your specific triggers. SEEK MEDICAL CARE IF:   You have a fever.  You develop a cough that does not stop easily (persistent).  You have shortness of breath.  You start wheezing.  Symptoms interfere with normal daily activities. Document Released: 07/22/2001 Document Revised: 11/01/2013 Document Reviewed: 07/04/2013 [facility] Patient Information 2015 [place], [place]. This information is not intended to replace advice given to you by your health care provider. Make sure you discuss any questions you have with your health care provider. Contact Dermatitis Contact dermatitis is a reaction to certain substances that touch the skin. Contact dermatitis can be either irritant contact dermatitis or allergic contact dermatitis. Irritant  contact dermatitis does not require previous exposure to the substance for a reaction to occur.Allergic contact dermatitis only occurs if you have been exposed to the substance before. Upon a repeat exposure, your body reacts to the substance.  CAUSES  Many substances can cause contact dermatitis. Irritant dermatitis is most commonly caused by repeated exposure to mildly irritating substances, such as:  Makeup.  Soaps.  Detergents.  Bleaches.  Acids.  Metal  salts, such as nickel. Allergic contact dermatitis is most commonly caused by exposure to:  Poisonous plants.  Chemicals (deodorants, shampoos).  Jewelry.  Latex.  Neomycin in triple antibiotic cream.  Preservatives in products, including clothing. SYMPTOMS  The area of skin that is exposed may develop:  Dryness or flaking.  Redness.  Cracks.  Itching.  Pain or a burning sensation.  Blisters. With allergic contact dermatitis, there may also be swelling in areas such as the eyelids, mouth, or genitals.  DIAGNOSIS  Your caregiver can usually tell what the problem is by doing a physical exam. In cases where the cause is uncertain and an allergic contact dermatitis is suspected, a patch skin test may be performed to help determine the cause of your dermatitis. TREATMENT Treatment includes protecting the skin from further contact with the irritating substance by avoiding that substance if possible. Barrier creams, powders, and gloves may be helpful. Your caregiver may also recommend:  Steroid creams or ointments applied 2 times daily. For best results, soak the rash area in cool water for 20 minutes. Then apply the medicine. Cover the area with a plastic wrap. You can store the steroid cream in the refrigerator for a "chilly" effect on your rash. That may decrease itching. Oral steroid medicines may be needed in more severe cases.  Antibiotics or antibacterial ointments if a skin infection is present.  Antihistamine lotion or an antihistamine taken by mouth to ease itching.  Lubricants to keep moisture in your skin.  Burow's solution to reduce redness and soreness or to dry a weeping rash. Mix one packet or tablet of solution in 2 cups cool water. Dip a clean washcloth in the mixture, wring it out a bit, and put it on the affected area. Leave the cloth in place for 30 minutes. Do this as often as possible throughout the day.  Taking several cornstarch or baking soda baths daily  if the area is too large to cover with a washcloth. Harsh chemicals, such as alkalis or acids, can cause skin damage that is like a burn. You should flush your skin for 15 to 20 minutes with cold water after such an exposure. You should also seek immediate medical care after exposure. Bandages (dressings), antibiotics, and pain medicine may be needed for severely irritated skin.  HOME CARE INSTRUCTIONS  Avoid the substance that caused your reaction.  Keep the area of skin that is affected away from hot water, soap, sunlight, chemicals, acidic substances, or anything else that would irritate your skin.  Do not scratch the rash. Scratching may cause the rash to become infected.  You may take cool baths to help stop the itching.  Only take over-the-counter or prescription medicines as directed by your caregiver.  See your caregiver for follow-up care as directed to make sure your skin is healing properly. SEEK MEDICAL CARE IF:   Your condition is not better after 3 days of treatment.  You seem to be getting worse.  You see signs of infection such as swelling, tenderness, redness, soreness, or warmth in the  affected area.  You have any problems related to your medicines. Document Released: 10/24/2000 Document Revised: 01/19/2012 Document Reviewed: 04/01/2011 [facility] Patient Information 2015 [place], [place]. This information is not intended to replace advice given to you by your health care provider. Make sure you discuss any questions you have with your health care provider.

## 2015-01-12 ENCOUNTER — Encounter: Payer: Self-pay | Admitting: Family Medicine

## 2015-03-12 ENCOUNTER — Emergency Department (HOSPITAL_COMMUNITY)
Admission: EM | Admit: 2015-03-12 | Discharge: 2015-03-12 | Disposition: A | Discharge status: Home / Self Care | Payer: No Typology Code available for payment source | Attending: Emergency Medicine | Admitting: Emergency Medicine

## 2015-03-12 ENCOUNTER — Emergency Department (HOSPITAL_COMMUNITY): Admission: EM | Admit: 2015-03-12 | Discharge: 2015-03-12 | Payer: Self-pay | Source: Home / Self Care

## 2015-03-12 ENCOUNTER — Encounter (HOSPITAL_COMMUNITY): Payer: Self-pay | Admitting: Emergency Medicine

## 2015-03-12 DIAGNOSIS — Y998 Other external cause status: Secondary | ICD-10-CM | POA: Insufficient documentation

## 2015-03-12 DIAGNOSIS — Y9389 Activity, other specified: Secondary | ICD-10-CM | POA: Insufficient documentation

## 2015-03-12 DIAGNOSIS — X58XXXA Exposure to other specified factors, initial encounter: Secondary | ICD-10-CM | POA: Insufficient documentation

## 2015-03-12 DIAGNOSIS — T7840XA Allergy, unspecified, initial encounter: Secondary | ICD-10-CM | POA: Insufficient documentation

## 2015-03-12 DIAGNOSIS — Z8639 Personal history of other endocrine, nutritional and metabolic disease: Secondary | ICD-10-CM | POA: Insufficient documentation

## 2015-03-12 DIAGNOSIS — L509 Urticaria, unspecified: Secondary | ICD-10-CM

## 2015-03-12 DIAGNOSIS — Y9289 Other specified places as the place of occurrence of the external cause: Secondary | ICD-10-CM | POA: Insufficient documentation

## 2015-03-12 DIAGNOSIS — Z79899 Other long term (current) drug therapy: Secondary | ICD-10-CM | POA: Insufficient documentation

## 2015-03-12 DIAGNOSIS — Z7952 Long term (current) use of systemic steroids: Secondary | ICD-10-CM | POA: Insufficient documentation

## 2015-03-12 DIAGNOSIS — L500 Allergic urticaria: Secondary | ICD-10-CM | POA: Insufficient documentation

## 2015-03-12 HISTORY — DX: Hyperlipidemia, unspecified: E78.5

## 2015-03-12 MED ORDER — METHYLPREDNISOLONE SODIUM SUCC 125 MG IJ SOLR
125.0000 mg | Freq: Once | INTRAMUSCULAR | Status: AC
  Administered 2015-03-12: 125 mg via INTRAVENOUS
  Filled 2015-03-12: qty 2

## 2015-03-12 MED ORDER — FAMOTIDINE 20 MG PO TABS: 20.0000 mg | ORAL_TABLET | Freq: Two times a day (BID) | ORAL | Status: DC

## 2015-03-12 MED ORDER — FAMOTIDINE IN NACL 20-0.9 MG/50ML-% IV SOLN
20.0000 mg | Freq: Once | INTRAVENOUS | Status: AC
  Administered 2015-03-12: 20 mg via INTRAVENOUS
  Filled 2015-03-12: qty 50

## 2015-03-12 MED ORDER — SODIUM CHLORIDE 0.9 % IV SOLN
1000.0000 mL | INTRAVENOUS | Status: DC
  Administered 2015-03-12: 1000 mL via INTRAVENOUS

## 2015-03-12 MED ORDER — DIPHENHYDRAMINE HCL 25 MG PO TABS: 50.0000 mg | ORAL_TABLET | ORAL | Status: DC | PRN

## 2015-03-12 MED ORDER — SODIUM CHLORIDE 0.9 % IV SOLN
1000.0000 mL | Freq: Once | INTRAVENOUS | Status: AC
  Administered 2015-03-12: 1000 mL via INTRAVENOUS

## 2015-03-12 MED ORDER — PREDNISONE 20 MG PO TABS: ORAL_TABLET | ORAL | Status: DC

## 2015-03-12 MED ORDER — DIPHENHYDRAMINE HCL 50 MG/ML IJ SOLN
50.0000 mg | Freq: Once | INTRAMUSCULAR | Status: AC
  Administered 2015-03-12: 50 mg via INTRAVENOUS
  Filled 2015-03-12: qty 1

## 2015-03-12 NOTE — ED Notes (Addendum)
Patient states @ 1 hour ago he had carrot juice and egg sandwich. Patient states he began having facial swelling with hotness at that time. Patient eyes reddened. Patient able to speak in complete sentences. Patient took Zyrtec after reaction symptoms began.

## 2015-03-12 NOTE — Discharge Instructions (Signed)
Hives Hives are itchy, red, swollen areas of the skin. They can vary in size and location on your body. Hives can come and go for hours or several days (acute hives) or for several weeks (chronic hives). Hives do not spread from person to person (noncontagious). They may get worse with scratching, exercise, and emotional stress. CAUSES   Allergic reaction to food, additives, or drugs.  Infections, including the common cold.  Illness, such as vasculitis, lupus, or thyroid disease.  Exposure to sunlight, heat, or cold.  Exercise.  Stress.  Contact with chemicals. SYMPTOMS   Red or white swollen patches on the skin. The patches may change size, shape, and location quickly and repeatedly.  Itching.  Swelling of the hands, feet, and face. This may occur if hives develop deeper in the skin. DIAGNOSIS  Your caregiver can usually tell what is wrong by performing a physical exam. Skin or blood tests may also be done to determine the cause of your hives. In some cases, the cause cannot be determined. TREATMENT  Mild cases usually get better with medicines such as antihistamines. Severe cases may require an emergency epinephrine injection. If the cause of your hives is known, treatment includes avoiding that trigger.  HOME CARE INSTRUCTIONS   Avoid causes that trigger your hives.  Take antihistamines as directed by your caregiver to reduce the severity of your hives. Non-sedating or low-sedating antihistamines are usually recommended. Do not drive while taking an antihistamine.  Take any other medicines prescribed for itching as directed by your caregiver.  Wear loose-fitting clothing.  Keep all follow-up appointments as directed by your caregiver. SEEK MEDICAL CARE IF:   You have persistent or severe itching that is not relieved with medicine.  You have painful or swollen joints. SEEK IMMEDIATE MEDICAL CARE IF:   You have a fever.  Your tongue or lips are swollen.  You have  trouble breathing or swallowing.  You feel tightness in the throat or chest.  You have abdominal pain. These problems may be the first sign of a life-threatening allergic reaction. Call your local emergency services (911 in U.S.). MAKE SURE YOU:   Understand these instructions.  Will watch your condition.  Will get help right away if you are not doing well or get worse. Document Released: 10/27/2005 Document Revised: 11/01/2013 Document Reviewed: 01/20/2012 [facility] Patient Information 2015 [place], [place]. This information is not intended to replace advice given to you by your health care provider. Make sure you discuss any questions you have with your health care provider. Possible Food Allergy A food allergy occurs from eating something you are sensitive to. Food allergies occur in all age groups. It may be passed to you from your parents (heredity).  CAUSES  Some common causes are cow's milk, seafood, eggs, nuts (including peanut butter), wheat, and soybeans. SYMPTOMS  Common problems are:   Swelling around the mouth.  An itchy, red rash.  Hives.  Vomiting.  Diarrhea. Severe allergic reactions are life-threatening. This reaction is called anaphylaxis. It can cause the mouth and throat to swell. This makes it hard to breathe and swallow. In severe reactions, only a small amount of food may be fatal within seconds. HOME CARE INSTRUCTIONS   If you are unsure what caused the reaction, keep a diary of foods eaten and symptoms that followed. Avoid foods that cause reactions.  If hives or rash are present:  Take medicines as directed.  Use an over-the-counter antihistamine (diphenhydramine) to treat hives and itching as needed.  Apply cold compresses to the skin or take baths in cool water. Avoid hot baths or showers. These will increase the redness and itching.  If you are severely allergic:  Hospitalization is often required following a severe reaction.  Wear a medical  alert bracelet or necklace that describes the allergy.  Carry your anaphylaxis kit or epinephrine injection with you at all times. Both you and your family members should know how to use this. This can be lifesaving if you have a severe reaction. If epinephrine is used, it is important for you to seek immediate medical care or call your local emergency services (911 in U.S.). When the epinephrine wears off, it can be followed by a delayed reaction, which can be fatal.  Replace your epinephrine immediately after use in case of another reaction.  Ask your caregiver for instructions if you have not been taught how to use an epinephrine injection.  Do not drive until medicines used to treat the reaction have worn off, unless approved by your caregiver. SEEK MEDICAL CARE IF:   You suspect a food allergy. Symptoms generally happen within 30 minutes of eating a food.  Your symptoms have not gone away within 2 days. See your caregiver sooner if symptoms are getting worse.  You develop new symptoms.  You want to retest yourself with a food or drink you think causes an allergic reaction. Never do this if an anaphylactic reaction to that food or drink has happened before.  There is a return of the symptoms which brought you to your caregiver. SEEK IMMEDIATE MEDICAL CARE IF:   You have trouble breathing, are wheezing, or you have a tight feeling in your chest or throat.  You have a swollen mouth, or you have hives, swelling, or itching all over your body. Use your epinephrine injection immediately. This is given into the outside of your thigh, deep into the muscle. Following use of the epinephrine injection, seek help right away. Seek immediate medical care or call your local emergency services (911 in U.S.). MAKE SURE YOU:   Understand these instructions.  Will watch your condition.  Will get help right away if you are not doing well or get worse. Document Released: 10/24/2000 Document Revised:  01/19/2012 Document Reviewed: 06/15/2008 [facility] Patient Information 2015 [place], [place]. This information is not intended to replace advice given to you by your health care provider. Make sure you discuss any questions you have with your health care provider.  Emergency Department Resource Guide 1) Find a Doctor and Pay Out of Pocket Although you won't have to find out who is covered by your insurance plan, it is a good idea to ask around and get recommendations. You will then need to call the office and see if the doctor you have chosen will accept you as a new patient and what types of options they offer for patients who are self-pay. Some doctors offer discounts or will set up payment plans for their patients who do not have insurance, but you will need to ask so you aren't surprised when you get to your appointment.  2) Contact Your Local Health Department Not all health departments have doctors that can see patients for sick visits, but many do, so it is worth a call to see if yours does. If you don't know where your local health department is, you can check in your phone book. The CDC also has a tool to help you locate your state's health department, and many state websites also  have listings of all of their local health departments.  3) Find a [facility] If your illness is not likely to be very severe or complicated, you may want to try a walk in clinic. These are popping up all over the country in pharmacies, drugstores, and shopping centers. They're usually staffed by nurse practitioners or physician assistants that have been trained to treat common illnesses and complaints. They're usually fairly quick and inexpensive. However, if you have serious medical issues or chronic medical problems, these are probably not your best option.  No Primary Care Doctor: - Call Health Connect at  [phone] - they can help you locate a primary care doctor that  accepts your insurance, provides certain  services, etc. - Physician Referral Service- [phone]  Chronic Pain Problems: Organization         Address  Phone   Notes  [facility]  [phone] Patients need to be referred by their primary care doctor.   Medication Assistance: Organization         Address  Phone   Notes  [facility] Medication [facility] [place] [place], [place] [address] [phone] --Must be a resident of [facility] -- Must have NO insurance coverage whatsoever (no Medicaid/ Medicare, etc.) -- The pt. MUST have a primary care doctor that directs their care regularly and follows them in the community   MedAssist  [phone]   [employer]  [phone]    Agencies that provide inexpensive medical care: Organization         Address  Phone   Notes  [place]  [phone]   [facility]    [phone]   [facility] [place], [place] [address] [phone]   [place] [address] [place] [phone]   Planned Parenthood    [phone]   [facility]    [phone]   [place] and [address], [place] Phone:  [phone], Fax:  [phone] Hours of Operation:  9 am - 6 pm, M-F.  Also accepts Medicaid/Medicare and self-pay.  [facility] for [address], [address], [place] Phone: [phone], Fax: [phone]. Hours of Operation:  8:30 am - 5:30 pm, M-F.  Also accepts Medicaid and self-pay.  [facility] [place] [address] [place] Phone: [phone]   [place], [place], [place] [phone], Ext. [phone] Mondays & Thursdays: 7-9 AM.  First 15 patients are seen on a first come, first serve basis.    [place] Providers:  Organization         Address  Phone   Notes  [facility] [address], [place] [phone] Also accepts self-pay patients.  [facility] [address], [place]  [phone]   [place], [address], [place] [phone]   [facility] [address], [place] [phone]   Lucianne Lei [address], [place]   [phone] Only accepts [place] Access [place] patients after they have their name applied to their card.   Self-Pay (no insurance) in [facility]:  [employer]  Notes  Sickle Cell Patients, [place] [phone]   [facility] [place] [phone]   [facility] [place]  [place], [place], [place] [phone]   [facility]/Dr. Osei-Bonsu  [address], [place] or [address], [place] [phone] Phone number for both [place] and [place] locations is the same.  Urgent Medical and [facility] [address], [place] [phone]   [facility] [address] [place] or [address] [phone]   [facility] [address], [place] [phone], phone; [phone], fax Sees patients 1st and 3rd Saturday of every month.  Must not qualify for public or private insurance (i.e. Medicaid, Medicare, [place] Health Choice, Veterans' Benefits)  Household income should be no more than 200% of the poverty level The clinic cannot treat you if you are pregnant or think you are pregnant  Sexually transmitted diseases are not treated at the clinic.    Dental Care: Organization         Address  Phone  Notes  [facility] of [facility] [place] [phone] Accepts  children up to age 18 who are enrolled in [place] or [place]; pregnant women with a Medicaid card; and children who have applied for Medicaid or [place] Health Choice, but were declined, whose parents can pay a reduced fee at time of service.  [facility] Department of [facility]  [address], [place] [phone] Accepts children up to age 57 who are enrolled in [place] or [place]; pregnant women with a Medicaid card; and children who have applied for Medicaid or [place] Health Choice, but were declined, whose parents can pay a reduced fee at time of service.  [place] Adult [facility] Access PROGRAM  [place] [phone] Patients are seen by appointment only. Walk-ins are not accepted. [place] will see patients 27 years of age and older. Monday - Tuesday (8am-5pm) Most Wednesdays (8:30-5pm) $30 per visit, cash only  [facility] Adult Dental Access PRO[facility]  [address], [facility] [phone] Patients are seen by appointment only. Walk-ins are not accepted. [place] will see patients 49 years of age and older. One Wednesday Evening (Monthly: Volunteer Based).  $30 per visit, cash only  [place]  [phone] for adults; Children under age 83, call Graduate Pediatric Dentistry at [phone]. Children aged 31-14, please call [phone] to request a pediatric application.  Dental services are provided in all areas of dental care including fillings, crowns and bridges, complete and partial dentures, implants, gum treatment, root canals, and extractions. Preventive care is also provided. Treatment is provided to both adults and children. Patients are selected via a lottery and there is often a waiting list.   [facility] [address] [place]  [phone] [email or web address]   [facility] [address] [place], [place] [phone], Ext. [phone] Second and  Fourth Thursday of each month, opens at 6:30 AM; Clinic ends at 9 AM.  Patients are seen on a first-come first-served basis, and a limited number are seen during each clinic.   [facility]  [address] Hillard Danker [place], [place] [phone]  Eligibility Requirements You must have lived in [place], [place], or [place] counties for at least the last three months.   You cannot be eligible for state or federal sponsored [employer], including [employer], [place], or [employer].   You generally cannot be eligible for healthcare insurance through your employer.    How to apply: Eligibility screenings are held every Tuesday and Wednesday afternoon from 1:00 pm until 4:00 pm. You do not need an appointment for the interview!  [facility] [address], [place], [place]   [place]  [place] Department  [place]  [phone]    Behavioral Health Resources in the Community: Intensive Outpatient Programs Organization         Address  Phone  Notes  [place] Joseph. [address], [place], [place] [phone]   [facility] Outpatient [address] [place], [place]   ADS: Alcohol & Drug Svcs [address], [place], [place]   [place] [address],  [place], [place] or [phone]   Substance Abuse Resources Organization         Address  Phone  Notes  Alcohol and Drug Services  [phone]   [place]  [phone]   [facility] [place]   Chinita Pester  [phone]   Residential & Outpatient Substance Abuse Program  [phone]   Psychological Services Organization         Address  Phone  Notes  [facility] [place]  [place]  [phone]   [place]  [address] [place] or [phone]    Mobile Crisis Teams Organization         Address  Phone  Notes  [facility], Mobile Crisis Care Unit  [phone]   [facility]  [address] [place], [place]   [place] Levels [address] [place] [phone]    Self-Help/Support Groups Organization         Address  Phone             Notes  [place]. of [place] - variety of support groups  [place] Call for more information  Narcotics Anonymous (NA), [facility] [address], [employer] [place]  2 meetings at this location   Special educational needs teacher         Address  Phone  Notes  [facility] Residential Treatment [place],    [place]  [phone]   [facility]  [address] [place] [id number], [place], [place]   [place], [place] [phone] Admissions: 8am-3pm M-F  Incentives Substance [place] [address],    [place], [place] [phone]   [facility] [address] Jadene Pierini [place], [place]   [facility] [address],  [place], [place]   [facility]ight Programs - Intensive Outpatient [address], [place], [place]   [facility] ([place].) [place].,  [place], [place] or [phone]   [facility] Services (RTS) [address] [place], [place] Accepts Medicaid  [facility] [place] [address],  [place] [phone] Substance Abuse/Addiction Treatment   [facility] Resources Organization  Address  Phone  Notes  [facility]  [phone]   Domenic Schwab, PhD [address] Arlis Porta [place], [place]   [phone] or [phone]   [facility] [place], [place] [phone]   [place] [address], [place], [place] [phone] Insurance/Medicaid/sponsorship through [facility] and Families [address] [place]                                    [place], [place] [phone] [place] [address], [place] [phone]    Dr. Adele Schilder  [phone]   [facility] Dept. 1) [address], [place] 2) [place] 3)  [place] [address], [place] [phone]  [phone]   [place] [phone] or [phone] (After Hours)      Angioedema Angioedema is a sudden swelling of tissues, often of the skin. It can occur on the face or genitals or in the abdomen or other body parts. The swelling usually develops over a short period and gets better in 24 to 48 hours. It often begins during the night and is found when the person wakes up. The person may also get red, itchy patches of skin (hives). Angioedema can be dangerous if it involves swelling of the air passages.  Depending on the cause, episodes of angioedema may only happen once, come back in unpredictable patterns, or repeat for several years and then gradually fade away.  CAUSES  Angioedema can be caused by an allergic reaction to various triggers. It can also result from nonallergic causes, including reactions to drugs, immune system disorders, viral infections, or an abnormal gene that is passed to you from your parents (hereditary). For some people with angioedema, the cause is unknown.  Some things that can trigger angioedema include:   Foods.   Medicines, such as ACE inhibitors, ARBs, nonsteroidal anti-inflammatory agents, or estrogen.   Latex.   Animal saliva.   Insect stings.   Dyes used in X-rays.   Mild injury.   Dental work.  Surgery.  Stress.   Sudden changes in temperature.    Exercise. SIGNS AND SYMPTOMS   Swelling of the skin.  Hives. If these are present, there is also intense itching.  Redness in the affected area.   Pain in the affected area.  Swollen lips or tongue.  Breathing problems. This may happen if the air passages swell.  Wheezing. If internal organs are involved, there may be:   Nausea.   Abdominal pain.   Vomiting.   Difficulty swallowing.   Difficulty passing urine. DIAGNOSIS   Your health care provider will examine the affected area and take a medical and family history.  Various tests may be done to help determine the cause. Tests may include:  Allergy skin tests to see if the problem is an allergic reaction.   Blood tests to check for hereditary angioedema.   Tests to check for underlying diseases that could cause the condition.   A review of your medicines, including over-the-counter medicines, may be done. TREATMENT  Treatment will depend on the cause of the angioedema. Possible treatments include:   Removal of anything that triggered the condition (such as stopping certain medicines).  Medicines to treat symptoms or prevent attacks. Medicines given may include:   Antihistamines.   Epinephrine injection.   Steroids.   Hospitalization may be required for severe attacks. If the air passages are affected, it can be an emergency. Tubes may need to be placed to keep the airway open. HOME CARE INSTRUCTIONS   Take all medicines as directed by your health care provider.  If you were given medicines for emergency allergy treatment, always carry them with you.  Wear a medical bracelet as directed by your health care provider.   Avoid known triggers. SEEK MEDICAL CARE IF:   You have repeat attacks of angioedema.   Your attacks are more frequent or more severe despite preventive measures.   You have hereditary angioedema and are considering having children. It is important to discuss with  your health care provider the risks of passing the condition on to your children. SEEK IMMEDIATE MEDICAL CARE IF:   You have severe swelling of the mouth, tongue, or lips.  You have difficulty breathing.   You have difficulty swallowing.   You faint. MAKE SURE YOU:  Understand these instructions.  Will watch your condition.  Will get help right away if you are not doing well or get worse. Document Released: 01/05/2002 Document Revised: 03/13/2014 Document Reviewed: 06/20/2013 [facility] Patient Information 2015 [place], [place]. This information is not intended to replace advice given to you by your health care provider. Make sure you discuss any questions you have with your health care provider.

## 2015-03-12 NOTE — ED Notes (Signed)
Rash/hives absent from bilateral groin, bilateral arm pit, and upper back.

## 2015-03-12 NOTE — ED Provider Notes (Signed)
CSN: [id number]     Arrival date & time 03/12/15  1017 History   First MD Initiated Contact with Patient 03/12/15 1037     Chief Complaint  Patient presents with  . Allergic Reaction     (Consider location/radiation/quality/duration/timing/severity/associated sxs/prior Treatment) HPI The patient states he drank a beet and carrot juice and ate a steak sandwich. They were all prepared at home. Within a few minutes he developed significant redness and swelling of his face. He reports he became very itchy and he felt slightly short of breath. He reports he has seasonal allergies but his never had any known food allergy. He takes Zyrtec regularly for the seasonal allergy. He tried taking his Zyrtec this morning when he developed these symptoms. He also notes for maybe 1-2 weeks he's been getting a rash in his axilla and groin bilaterally. He states he saw a doctor was diagnosed with dermatitis and given a cream to apply to it. He states he has hypercholesterolemia but no other known medical problems. He's been otherwise well without symptoms of fever chills or general illness. Past Medical History  Diagnosis Date  . Hyperlipidemia    History reviewed. No pertinent past surgical history. History reviewed. No pertinent family history. History  Substance Use Topics  . Smoking status: Never Smoker   . Smokeless tobacco: Not on file  . Alcohol Use: No    Review of Systems  10 Systems reviewed and are negative for acute change except as noted in the HPI.   Allergies  Review of patient's allergies indicates no known allergies.  Home Medications   Prior to Admission medications   Medication Sig Start Date End Date Taking? Authorizing Provider  cetirizine (ZYRTEC) 10 MG tablet Take 1 tablet (10 mg total) by mouth daily. 01/10/15  Yes Massie Maroon, FNP  diphenhydrAMINE (BENADRYL) 25 MG tablet Take 2 tablets (50 mg total) by mouth every 4 (four) hours as needed for itching. 03/12/15   Arby Barrette, MD  famotidine (PEPCID) 20 MG tablet Take 1 tablet (20 mg total) by mouth 2 (two) times daily. 03/12/15   Arby Barrette, MD  predniSONE (DELTASONE) 20 MG tablet 3 tabs po day one, then 2 tabs daily x 4 days 03/12/15   Arby Barrette, MD  triamcinolone cream (KENALOG) 0.1 % Apply 1 application topically 2 (two) times daily. Patient not taking: Reported on 03/12/2015 01/10/15   Massie Maroon, FNP   BP 125/78 mmHg  Pulse 96  Temp(Src) 97.7 F (36.5 C) (Oral)  Resp 20  SpO2 98% Physical Exam  Constitutional: He is oriented to person, place, and time. He appears well-developed and well-nourished.  Patient is alert and appropriate. He has no respiratory distress. He is nontoxic. He does have obvious urticarial rash and erythema of his face and head.  HENT:  Head: Normocephalic and atraumatic.  Nose: Nose normal.  Mouth/Throat: Oropharynx is clear and moist. No oropharyngeal exudate.  Eyes: EOM are normal. Pupils are equal, round, and reactive to light.  Bilateral conjunctiva are injected. Patient has mild periorbital edema. Bilateral symmetric.  Neck: Neck supple.  Cardiovascular: Normal rate, regular rhythm, normal heart sounds and intact distal pulses.   Pulmonary/Chest: Effort normal and breath sounds normal. No stridor. He has no wheezes.  Abdominal: Soft. Bowel sounds are normal. He exhibits no distension. There is no tenderness.  Musculoskeletal: Normal range of motion. He exhibits no edema.  Neurological: He is alert and oriented to person, place, and time. He has normal strength.  Coordination normal. GCS eye subscore is 4. GCS verbal subscore is 5. GCS motor subscore is 6.  Skin: Skin is warm, dry and intact. Rash noted.  The patient has a pink plaque rash that is in bilateral axilla and over the bathing suit region of the groin. There are also oval urticaria present within the plaque. The patient's head has a diffuse urticarial, erythematous edema. This goes down to the base of  his neck. Airway is patent without any localizing perioral swelling.  Psychiatric: He has a normal mood and affect.    ED Course  Procedures (including critical care time) Labs Review Labs Reviewed - No data to display  Imaging Review No results found.   EKG Interpretation None     Recheck 1410. Patient's symptoms have completely resolved. All urticarial plaques on his head face and in his eczema and groin are resolved. MDM   Final diagnoses:  Allergic reaction, initial encounter  Urticaria    Findings are consistent with acute allergic reaction of an urticarial type. At this point the source is unknown. It seemed to worsen with a food ingestion this morning however the patient already had the inciting event apparently with axillary and groin urticaria.    Arby BarretteMarcy Tatianna Ibbotson, MD 03/12/15 1425

## 2015-03-28 ENCOUNTER — Encounter: Payer: Self-pay | Admitting: Family Medicine

## 2015-03-28 ENCOUNTER — Ambulatory Visit (INDEPENDENT_AMBULATORY_CARE_PROVIDER_SITE_OTHER): Payer: No Typology Code available for payment source | Admitting: Family Medicine

## 2015-03-28 VITALS — BP 106/74 | HR 67 | Temp 97.8°F | Resp 16 | Ht 63.0 in | Wt 166.0 lb

## 2015-03-28 DIAGNOSIS — Z91018 Allergy to other foods: Secondary | ICD-10-CM | POA: Insufficient documentation

## 2015-03-28 DIAGNOSIS — J309 Allergic rhinitis, unspecified: Secondary | ICD-10-CM

## 2015-03-28 DIAGNOSIS — L239 Allergic contact dermatitis, unspecified cause: Secondary | ICD-10-CM | POA: Insufficient documentation

## 2015-03-28 DIAGNOSIS — L200 Besnier's prurigo: Secondary | ICD-10-CM

## 2015-03-28 MED ORDER — CETIRIZINE HCL 10 MG PO TABS: 10.0000 mg | ORAL_TABLET | Freq: Every day | ORAL | Status: DC

## 2015-03-28 MED ORDER — EPINEPHRINE 0.3 MG/0.3ML IJ SOAJ: 0.3000 mg | Freq: Once | INTRAMUSCULAR | Status: DC

## 2015-03-28 MED ORDER — TRIAMCINOLONE ACETONIDE 0.1 % EX CREA: 1.0000 "application " | TOPICAL_CREAM | Freq: Two times a day (BID) | CUTANEOUS | Status: DC

## 2015-03-28 NOTE — Progress Notes (Signed)
Subjective:    Patient ID: Warren Owens, male    DOB: [birth year], 39 y.o.   MRN: [id number]  HPI Warren Owens, a 39 year old male with a history of seasonal allergies presents complaining of a generalized rash. Patient complains of rash involving the generalized. Rash started several days ago. Appearance of rash at onset: red and widespread. Rash has not changed over tim. Associated symptoms include itching.  Denies: abdominal pain, arthralgia, congestion, cough, decrease in appetite, headache, irritability, nausea and sore throat. Patient has had previous evaluation of rash. Patient was treated with Triamcinolone with maximum effectiveness.  Patient has not identified precipitant. Patient has not had new exposures including soaps, lotions, laundry detergents, foods, medications, plants, insects or animals.   Patient also reports that he was evaluated in the emergency room 2 weeks ago with an allergic reaction to food. He states that he had a carrot juice and a sandwich. He reports that his throat started to close within 5 minutes of eating. He states that he was started on Prednisone and Pepcid by the emergency department. Patient has not identified the food that caused the allergic reaction. He states that he experiences an itching throat when eating apples and pears.   Past Medical History  Diagnosis Date  . Hyperlipidemia    History   Social History  . Marital Status: Married    Spouse Name: N/A  . Number of Children: N/A  . Years of Education: N/A   Occupational History  . Not on file.   Social History Main Topics  . Smoking status: Never Smoker   . Smokeless tobacco: Not on file  . Alcohol Use: No  . Drug Use: No  . Sexual Activity: Not on file   Other Topics Concern  . Not on file   Social History Narrative   Review of Systems  Constitutional: Negative.   HENT: Positive for postnasal drip and sneezing.   Eyes: Positive for redness.  Respiratory:  Negative.   Cardiovascular: Negative.   Gastrointestinal: Negative.   Endocrine: Negative.   Genitourinary: Negative.   Musculoskeletal: Negative.   Skin: Positive for rash (Generalized).  Allergic/Immunologic: Positive for environmental allergies and food allergies (Patient was evaluated in the emergeny room on 03/12/2015 for an allergic reaction to carrot juice).  Neurological: Negative.   Hematological: Negative.   Psychiatric/Behavioral: Negative.        Objective:   Physical Exam  Constitutional: He is oriented to person, place, and time. He appears well-developed and well-nourished.  HENT:  Head: Normocephalic and atraumatic.  Right Ear: External ear normal.  Left Ear: External ear normal.  Mouth/Throat: Oropharynx is clear and moist.  Eyes: Conjunctivae and EOM are normal. Pupils are equal, round, and reactive to light.  Neck: Normal range of motion. Neck supple.  Cardiovascular: Normal rate, regular rhythm, normal heart sounds and intact distal pulses.   Pulmonary/Chest: Effort normal and breath sounds normal.  Abdominal: Soft. Bowel sounds are normal.  Neurological: He is alert and oriented to person, place, and time. He has normal reflexes.  Skin: Skin is warm and dry. Rash noted. Rash is maculopapular (erythematous rash).  Psychiatric: He has a normal mood and affect. His behavior is normal. Judgment and thought content normal.      BP 106/74 mmHg  Pulse 67  Temp(Src) 97.8 F (36.6 C) (Oral)  Resp 16  Ht 5\' 3"  (1.6 m)  Wt 166 lb (75.297 kg)  BMI 29.41 kg/m2 Assessment & Plan:  1. Allergic  rhinitis, unspecified allergic rhinitis type Mr. Marjorie Smolder continues to experience symptoms of seasonal allergic rhinitis. Recommend that he continue Zyrtec daily. Patient has not identified specific allergens to avoid. Recommend that he avoid going outdoors during periods of high pollen index.  - cetirizine (ZYRTEC) 10 MG tablet; Take 1 tablet (10 mg total) by mouth daily.   Dispense: 30 tablet; Refill: 3 - Ambulatory referral to Allergy  2. Allergic dermatitis Recommend that he utilize [employer], hypoallergenic laundry detergent and lotions. Also recommended that Mr. Marjorie Smolder clean carpeting frequently.  - triamcinolone cream (KENALOG) 0.1 %; Apply 1 application topically 2 (two) times daily.  Dispense: 30 g; Refill: 1  - Ambulatory referral to Allergy  3. Multiple food allergies - EPINEPHrine (EPIPEN 2-PAK) 0.3 mg/0.3 mL IJ SOAJ injection; Inject 0.3 mLs (0.3 mg total) into the muscle once.  Dispense: 1 Device; Refill: 0   RTC: As previously scheduled Warren Maroon, FNP

## 2015-03-28 NOTE — Patient Instructions (Signed)
Recommend Dove Unscented Soap/BodyWash Recommend hypoallergenic laundry detergent Recommend hypoallergenic lotion Avoid environmental allergens   Allergies Allergies may happen from anything your body is sensitive to. This may be food, medicines, pollens, chemicals, and nearly anything around you in everyday life that produces allergens. An allergen is anything that causes an allergy producing substance. Heredity is often a factor in causing these problems. This means you may have some of the same allergies as your parents. Food allergies happen in all age groups. Food allergies are some of the most severe and life threatening. Some common food allergies are cow's milk, seafood, eggs, nuts, wheat, and soybeans. SYMPTOMS   Swelling around the mouth.  An itchy red rash or hives.  Vomiting or diarrhea.  Difficulty breathing. SEVERE ALLERGIC REACTIONS ARE LIFE-THREATENING. This reaction is called anaphylaxis. It can cause the mouth and throat to swell and cause difficulty with breathing and swallowing. In severe reactions only a trace amount of food (for example, peanut oil in a salad) may cause death within seconds. Seasonal allergies occur in all age groups. These are seasonal because they usually occur during the same season every year. They may be a reaction to molds, grass pollens, or tree pollens. Other causes of problems are house dust mite allergens, pet dander, and mold spores. The symptoms often consist of nasal congestion, a runny itchy nose associated with sneezing, and tearing itchy eyes. There is often an associated itching of the mouth and ears. The problems happen when you come in contact with pollens and other allergens. Allergens are the particles in the air that the body reacts to with an allergic reaction. This causes you to release allergic antibodies. Through a chain of events, these eventually cause you to release histamine into the blood stream. Although it is meant to be  protective to the body, it is this release that causes your discomfort. This is why you were given anti-histamines to feel better. If you are unable to pinpoint the offending allergen, it may be determined by skin or blood testing. Allergies cannot be cured but can be controlled with medicine. Hay fever is a collection of all or some of the seasonal allergy problems. It may often be treated with simple over-the-counter medicine such as diphenhydramine. Take medicine as directed. Do not drink alcohol or drive while taking this medicine. Check with your caregiver or package insert for child dosages. If these medicines are not effective, there are many new medicines your caregiver can prescribe. Stronger medicine such as nasal spray, eye drops, and corticosteroids may be used if the first things you try do not work well. Other treatments such as immunotherapy or desensitizing injections can be used if all else fails. Follow up with your caregiver if problems continue. These seasonal allergies are usually not life threatening. They are generally more of a nuisance that can often be handled using medicine. HOME CARE INSTRUCTIONS   If unsure what causes a reaction, keep a diary of foods eaten and symptoms that follow. Avoid foods that cause reactions.  If hives or rash are present:  Take medicine as directed.  You may use an over-the-counter antihistamine (diphenhydramine) for hives and itching as needed.  Apply cold compresses (cloths) to the skin or take baths in cool water. Avoid hot baths or showers. Heat will make a rash and itching worse.  If you are severely allergic:  Following a treatment for a severe reaction, hospitalization is often required for closer follow-up.  Wear a medic-alert bracelet or necklace  stating the allergy.  You and your family must learn how to give adrenaline or use an anaphylaxis kit.  If you have had a severe reaction, always carry your anaphylaxis kit or EpiPen  with you. Use this medicine as directed by your caregiver if a severe reaction is occurring. Failure to do so could have a fatal outcome. SEEK MEDICAL CARE IF:  You suspect a food allergy. Symptoms generally happen within 30 minutes of eating a food.  Your symptoms have not gone away within 2 days or are getting worse.  You develop new symptoms.  You want to retest yourself or your child with a food or drink you think causes an allergic reaction. Never do this if an anaphylactic reaction to that food or drink has happened before. Only do this under the care of a caregiver. SEEK IMMEDIATE MEDICAL CARE IF:   You have difficulty breathing, are wheezing, or have a tight feeling in your chest or throat.  You have a swollen mouth, or you have hives, swelling, or itching all over your body.  You have had a severe reaction that has responded to your anaphylaxis kit or an EpiPen. These reactions may return when the medicine has worn off. These reactions should be considered life threatening. MAKE SURE YOU:   Understand these instructions.  Will watch your condition.  Will get help right away if you are not doing well or get worse. Document Released: 01/20/2003 Document Revised: 02/21/2013 Document Reviewed: 06/26/2008 [facility] Patient Information 2015 [place], [place]. This information is not intended to replace advice given to you by your health care provider. Make sure you discuss any questions you have with your health care provider. Contact Dermatitis Contact dermatitis is a reaction to certain substances that touch the skin. Contact dermatitis can be either irritant contact dermatitis or allergic contact dermatitis. Irritant contact dermatitis does not require previous exposure to the substance for a reaction to occur.Allergic contact dermatitis only occurs if you have been exposed to the substance before. Upon a repeat exposure, your body reacts to the substance.  CAUSES  Many substances  can cause contact dermatitis. Irritant dermatitis is most commonly caused by repeated exposure to mildly irritating substances, such as:  Makeup.  Soaps.  Detergents.  Bleaches.  Acids.  Metal salts, such as nickel. Allergic contact dermatitis is most commonly caused by exposure to:  Poisonous plants.  Chemicals (deodorants, shampoos).  Jewelry.  Latex.  Neomycin in triple antibiotic cream.  Preservatives in products, including clothing. SYMPTOMS  The area of skin that is exposed may develop:  Dryness or flaking.  Redness.  Cracks.  Itching.  Pain or a burning sensation.  Blisters. With allergic contact dermatitis, there may also be swelling in areas such as the eyelids, mouth, or genitals.  DIAGNOSIS  Your caregiver can usually tell what the problem is by doing a physical exam. In cases where the cause is uncertain and an allergic contact dermatitis is suspected, a patch skin test may be performed to help determine the cause of your dermatitis. TREATMENT Treatment includes protecting the skin from further contact with the irritating substance by avoiding that substance if possible. Barrier creams, powders, and gloves may be helpful. Your caregiver may also recommend:  Steroid creams or ointments applied 2 times daily. For best results, soak the rash area in cool water for 20 minutes. Then apply the medicine. Cover the area with a plastic wrap. You can store the steroid cream in the refrigerator for a "chilly" effect on  your rash. That may decrease itching. Oral steroid medicines may be needed in more severe cases.  Antibiotics or antibacterial ointments if a skin infection is present.  Antihistamine lotion or an antihistamine taken by mouth to ease itching.  Lubricants to keep moisture in your skin.  Burow's solution to reduce redness and soreness or to dry a weeping rash. Mix one packet or tablet of solution in 2 cups cool water. Dip a clean washcloth in the  mixture, wring it out a bit, and put it on the affected area. Leave the cloth in place for 30 minutes. Do this as often as possible throughout the day.  Taking several cornstarch or baking soda baths daily if the area is too large to cover with a washcloth. Harsh chemicals, such as alkalis or acids, can cause skin damage that is like a burn. You should flush your skin for 15 to 20 minutes with cold water after such an exposure. You should also seek immediate medical care after exposure. Bandages (dressings), antibiotics, and pain medicine may be needed for severely irritated skin.  HOME CARE INSTRUCTIONS  Avoid the substance that caused your reaction.  Keep the area of skin that is affected away from hot water, soap, sunlight, chemicals, acidic substances, or anything else that would irritate your skin.  Do not scratch the rash. Scratching may cause the rash to become infected.  You may take cool baths to help stop the itching.  Only take over-the-counter or prescription medicines as directed by your caregiver.  See your caregiver for follow-up care as directed to make sure your skin is healing properly. SEEK MEDICAL CARE IF:   Your condition is not better after 3 days of treatment.  You seem to be getting worse.  You see signs of infection such as swelling, tenderness, redness, soreness, or warmth in the affected area.  You have any problems related to your medicines. Document Released: 10/24/2000 Document Revised: 01/19/2012 Document Reviewed: 04/01/2011 [facility] Patient Information 2015 [place], [place]. This information is not intended to replace advice given to you by your health care provider. Make sure you discuss any questions you have with your health care provider. Epinephrine Injection Epinephrine is a medicine given by injection to temporarily treat an emergency allergic reaction. It is also used to treat severe asthmatic attacks and other lung problems. The medicine helps to  enlarge (dilate) the small breathing tubes of the lungs. A life-threatening, sudden allergic reaction that involves the whole body is called anaphylaxis. Because of potential side effects, epinephrine should only be used as directed by your caregiver. RISKS AND COMPLICATIONS Possible side effects of epinephrine injections include:  Chest pain.  Irregular or rapid heartbeat.  Shortness of breath.  Nausea.  Vomiting.  Abdominal pain or cramping.  Sweating.  Dizziness.  Weakness.  Headache.  Nervousness. Report all side effects to your caregiver. HOW TO GIVE AN EPINEPHRINE INJECTION Give the epinephrine injection immediately when symptoms of a severe reaction begin. Inject the medicine into the outer thigh or any available, large muscle. Your caregiver can teach you how to do this. You do not need to remove any clothing. After the injection, call your local emergency services (911 in [place] Even if you improve after the injection, you need to be examined at a hospital emergency department. Epinephrine works quickly, but it also wears off quickly. Delayed reactions can occur. A delayed reaction may be as serious and dangerous as the initial reaction. HOME CARE INSTRUCTIONS  Make sure you and your  family know how to give an epinephrine injection.  Use epinephrine injections as directed by your caregiver. Do not use this medicine more often or in larger doses than prescribed.  Always carry your epinephrine injection or anaphylaxis kit with you. This can be lifesaving if you have a severe reaction.  Store the medicine in a cool, dry place. If the medicine becomes discolored or cloudy, dispose of it properly and replace it with new medicine.  Check the expiration date on your medicine. It may be unsafe to use medicines past their expiration date.  Tell your caregiver about any other medicines you are taking. Some medicines can react badly with epinephrine.  Tell your caregiver about  any medical conditions you have, such as diabetes, high blood pressure (hypertension), heart disease, irregular heartbeats, or if you are pregnant. SEEK IMMEDIATE MEDICAL CARE IF:  You have used an epinephrine injection. Call your local emergency services (911 in [place] Even if you improve after the injection, you need to be examined at a hospital emergency department to make sure your allergic reaction is under control. You will also be monitored for adverse effects from the medicine.  You have chest pain.  You have irregular or fast heartbeats.  You have shortness of breath.  You have severe headaches.  You have severe nausea, vomiting, or abdominal cramps.  You have severe pain, swelling, or redness in the area where you gave the injection. Document Released: 10/24/2000 Document Revised: 01/19/2012 Document Reviewed: 07/16/2011 [facility] Patient Information 2015 [place], [place]. This information is not intended to replace advice given to you by your health care provider. Make sure you discuss any questions you have with your health care provider.

## 2015-04-12 ENCOUNTER — Other Ambulatory Visit (INDEPENDENT_AMBULATORY_CARE_PROVIDER_SITE_OTHER): Payer: No Typology Code available for payment source

## 2015-04-12 DIAGNOSIS — E785 Hyperlipidemia, unspecified: Secondary | ICD-10-CM

## 2015-04-12 DIAGNOSIS — R7303 Prediabetes: Secondary | ICD-10-CM

## 2015-04-12 DIAGNOSIS — R7309 Other abnormal glucose: Secondary | ICD-10-CM

## 2015-04-13 LAB — HEMOGLOBIN A1C
Hgb A1c MFr Bld: 5.6 % (ref <5.7)
MEAN PLASMA GLUCOSE: 114 mg/dL (ref <117)

## 2015-04-13 LAB — LIPID PANEL
CHOL/HDL RATIO: 5.1 ratio
CHOLESTEROL: 215 mg/dL — AB (ref 0–200)
HDL: 42 mg/dL (ref >=40)
LDL CALC: 117 mg/dL — AB (ref 0–99)
TRIGLYCERIDES: 281 mg/dL — AB (ref <150)
VLDL: 56 mg/dL — AB (ref 0–40)

## 2015-04-17 ENCOUNTER — Telehealth: Payer: Self-pay | Admitting: Family Medicine

## 2015-04-17 NOTE — Telephone Encounter (Signed)
Encounter opened in error.   Massie MaroonHollis,Zana Biancardi M, FNP

## 2015-04-19 ENCOUNTER — Ambulatory Visit (INDEPENDENT_AMBULATORY_CARE_PROVIDER_SITE_OTHER): Payer: No Typology Code available for payment source | Admitting: Internal Medicine

## 2015-04-19 VITALS — BP 117/74 | HR 97 | Temp 98.0°F | Resp 16 | Ht 63.0 in | Wt 167.0 lb

## 2015-04-19 DIAGNOSIS — R21 Rash and other nonspecific skin eruption: Secondary | ICD-10-CM

## 2015-04-19 NOTE — Patient Instructions (Signed)
Picadura de insectos  °(Insect Bite) ° Los mosquitos, las moscas, las pulgas, las chinches y muchos otros insectos pueden morder. Las picaduras de insectos son diferentes si tienen aguijón. El aguijón inyecta un veneno en la piel. Algunos insectos pueden transmitir enfermedades infecciosas con las picaduras.  °SÍNTOMAS  °La picadura generalmente se pone roja, se hincha y pica durante 2 a 4 días. Generalmente desaparecen por sí mismas.  °TRATAMIENTO  °El médico indicará antibióticos si aparece una infección bacteriana en la zona de la picadura.  °INSTRUCCIONES PARA EL CUIDADO EN EL HOGAR  °· No rasque la zona. °· Mantenga la zona limpia y seca. Lave la herida suavemente con agua jabonosa. °· Aplíquese hielo o compresas frías. °¨ Ponga el hielo en una bolsa plástica. °¨ Colóquese una toalla entre la piel y la bolsa de hielo. °¨ Deje la bolsa de hielo durante 20 minutos 4 veces por día, durante los primeros 2 ó 3 días, o según las indicaciones. °· Puede aplicar una pasta con bicarbonato de sodio, una crema con corticoides, una loción de calamina, según las indicaciones del médico. Esto ayuda a reducir la picazón y la hinchazón. °· Tome sólo medicamentos de venta libre o recetados, según las indicaciones del médico. °· Si le han recetado antibióticos, tómelos según las indicaciones. Tómelos todos, aunque se sienta mejor. °Deberá aplicarse la vacuna contra el tétanos si: °· No recuerda cuándo se colocó la vacuna la última vez. °· Nunca recibió esta vacuna. °· La lesión ha abierto su piel. °Si le han aplicado la vacuna contra el tétanos, el brazo podrá hincharse, enrojecer y sentirse caliente al tacto. Esto es frecuente y no es un problema. Si usted necesita aplicarse la vacuna y se niega a recibirla, corre riesgo de contraer tétanos. Ésta es una enfermedad grave.  °SOLICITE ATENCIÓN MÉDICA DE INMEDIATO SI:  °· Siente un dolor cada vez más intenso y observa enrojecimiento e hinchazón en la herida. °· Hay una línea roja en  la piel, cercana a la zona de la picadura. °· Tiene fiebre. °· Siente dolor en la articulación. °· Siente dolor de cabeza intenso o dolor en el cuello. °· Siente debilidad muscular no habitual. °· Tiene una erupción. °· Siente falta de aire o dolor en el pecho. °· Tiene dolor abdominal, náuseas o vómitos. °· Se siente muy cansado o confundido. °ESTÉ SEGURO QUE:  °· Comprende las instrucciones para el alta médica. °· Controlará su enfermedad. °· Solicitará atención médica de inmediato según las indicaciones. °Document Released: 10/27/2005 Document Revised: 01/19/2012 °ExitCare® Patient Information ©2015 [employer]. This information is not intended to replace advice given to you by your health care provider. Make sure you discuss any questions you have with your health care provider. ° °

## 2015-04-30 NOTE — Progress Notes (Signed)
Patient ID: Warren Owens, male   DOB: [birth year], 39 y.o.   MRN: [id number]   Warren Owens, is a 39 y.o. male  [name]  [name]  DOB - [birth year]  CC:  Chief Complaint  Patient presents with  . Follow-up    follow up on pre-diabetes and dyslipidemia        HPI: Warren Owens is a 39 y.o. male here today with c/o skin rash. He reports that the rash has been occurring on areas of his distal arms and legs. He describes that rash as "like bug bites". His wife is present with him and although they sleep in the same bed, she has no rash. He works for [employer] where he spends a considerable amount of time is dark spaces and also on a riding mower. He states that the rash was itching significantly but improved with the Triamcinolone cream issued by NP.   Patient has No headache, No chest pain, No abdominal pain - No Nausea, No new weakness tingling or numbness, No Cough - SOB.  No Known Allergies Past Medical History  Diagnosis Date  . Hyperlipidemia    Current Outpatient Prescriptions on File Prior to Visit  Medication Sig Dispense Refill  . cetirizine (ZYRTEC) 10 MG tablet Take 1 tablet (10 mg total) by mouth daily. 30 tablet 3  . triamcinolone cream (KENALOG) 0.1 % Apply 1 application topically 2 (two) times daily. 30 g 1  . diphenhydrAMINE (BENADRYL) 25 MG tablet Take 2 tablets (50 mg total) by mouth every 4 (four) hours as needed for itching. (Patient not taking: Reported on 03/28/2015) 20 tablet 0  . EPINEPHrine (EPIPEN 2-PAK) 0.3 mg/0.3 mL IJ SOAJ injection Inject 0.3 mLs (0.3 mg total) into the muscle once. (Patient not taking: Reported on 04/19/2015) 1 Device 0  . famotidine (PEPCID) 20 MG tablet Take 1 tablet (20 mg total) by mouth 2 (two) times daily. (Patient not taking: Reported on 03/28/2015) 14 tablet 0  . predniSONE (DELTASONE) 20 MG tablet 3 tabs po day one, then 2 tabs daily x 4 days (Patient not taking: Reported on 03/28/2015)  11 tablet 0   No current facility-administered medications on file prior to visit.   No family history on file. History   Social History  . Marital Status: Married    Spouse Name: N/A  . Number of Children: N/A  . Years of Education: N/A   Occupational History  . Not on file.   Social History Main Topics  . Smoking status: Never Smoker   . Smokeless tobacco: Not on file  . Alcohol Use: No  . Drug Use: No  . Sexual Activity: Not on file   Other Topics Concern  . Not on file   Social History Narrative    Review of Systems: Constitutional: Negative for fever, chills, diaphoresis, activity change, appetite change and fatigue. HENT: Negative for ear pain, nosebleeds, congestion, facial swelling, rhinorrhea, neck pain, neck stiffness and ear discharge.  Eyes: Negative for pain, discharge, redness, itching and visual disturbance. Respiratory: Negative for cough, choking, chest tightness, shortness of breath, wheezing and stridor.  Cardiovascular: Negative for chest pain, palpitations and leg swelling. Gastrointestinal: Negative for abdominal distention. Genitourinary: Negative for dysuria, urgency, frequency, hematuria, flank pain, decreased urine volume, difficulty urinating and dyspareunia.  Musculoskeletal: Negative for back pain, joint swelling, arthralgia and gait problem. Neurological: Negative for dizziness, tremors, seizures, syncope, facial asymmetry, speech difficulty, weakness, light-headedness, numbness and headaches.  Hematological: Negative for adenopathy. Does not bruise/bleed easily. Psychiatric/Behavioral:  Negative for hallucinations, behavioral problems, confusion, dysphoric mood, decreased concentration and agitation.     Objective:         Filed Vitals:   04/19/15 1551  BP: 117/74  Pulse: 97  Temp: 98 F (36.7 C)  Resp: 16    Physical Exam: Constitutional: Patient appears well-developed and well-nourished. No distress. HENT: Normocephalic,  atraumatic, External right and left ear normal. Oropharynx is clear and moist.  Eyes: Conjunctivae and EOM are normal. PERRLA, no scleral icterus. Neck: Normal ROM. Neck supple. No JVD. No tracheal deviation. No thyromegaly. CVS: RRR, S1/S2 +, no murmurs, no gallops, no carotid bruit.  Pulmonary: Effort and breath sounds normal, no stridor, rhonchi, wheezes, rales.  Abdominal: Soft. BS +, no distension, tenderness, rebound or guarding.  Musculoskeletal: Normal range of motion. No edema and no tenderness.  Lymphadenopathy: No lymphadenopathy noted, cervical, inguinal or axillary Neuro: Alert. Normal reflexes, muscle tone coordination. No cranial nerve deficit. Skin: Skin is warm and dry. No rash noted. Not diaphoretic. No erythema. No pallor. Psychiatric: Normal mood and affect. Behavior, judgment, thought content normal. Skin: Pt has some discrete non-erythematous "bumps" that have no secondary changes.  Lab Results  Component Value Date   WBC 7.7 07/18/2014   HGB 15.0 07/18/2014   HCT 43.4 07/18/2014   MCV 88.6 07/18/2014   PLT 312 07/18/2014   Lab Results  Component Value Date   CREATININE 0.88 07/18/2014   BUN 13 07/18/2014   NA 135 07/18/2014   K 4.2 07/18/2014   CL 103 07/18/2014   CO2 25 07/18/2014    Lab Results  Component Value Date   HGBA1C 5.6 04/12/2015   Lipid Panel     Component Value Date/Time   CHOL 215* 04/12/2015 0828   TRIG 281* 04/12/2015 0828   HDL 42 04/12/2015 0828   CHOLHDL 5.1 04/12/2015 0828   VLDL 56* 04/12/2015 0828   LDLCALC 117* 04/12/2015 0828       Assessment and plan:   1. Rash and nonspecific skin eruption - I suspect that these are insect bites. He has no secondary rash. I recommend wearing protective clothes and continued symptomatic management with anti-histamines and topical anti-itch cream.   Return in about 6 months (around 10/19/2015).  The patient was given clear instructions to go to ER or return to medical center if  symptoms don't improve, worsen or new problems develop. The patient verbalized understanding. The patient was told to call to get lab results if they haven't heard anything in the next week.     This note has been created with Education officer, environmental. Any transcriptional errors are unintentional.    Kendria Halberg A., MD [facility] [place], [place] [phone]   04/30/2015, 9:11 AM

## 2015-07-11 ENCOUNTER — Other Ambulatory Visit: Payer: No Typology Code available for payment source

## 2015-07-19 ENCOUNTER — Ambulatory Visit: Payer: No Typology Code available for payment source | Admitting: Internal Medicine

## 2015-09-18 ENCOUNTER — Ambulatory Visit: Payer: No Typology Code available for payment source

## 2015-10-22 ENCOUNTER — Ambulatory Visit: Payer: No Typology Code available for payment source | Admitting: Family Medicine

## 2015-11-16 ENCOUNTER — Ambulatory Visit: Payer: No Typology Code available for payment source | Attending: Family Medicine

## 2015-11-23 ENCOUNTER — Emergency Department (INDEPENDENT_AMBULATORY_CARE_PROVIDER_SITE_OTHER): Payer: No Typology Code available for payment source

## 2015-11-23 ENCOUNTER — Encounter (HOSPITAL_COMMUNITY): Payer: Self-pay | Admitting: *Deleted

## 2015-11-23 ENCOUNTER — Emergency Department (HOSPITAL_COMMUNITY)
Admission: EM | Admit: 2015-11-23 | Discharge: 2015-11-23 | Disposition: A | Discharge status: Home / Self Care | Payer: No Typology Code available for payment source | Source: Home / Self Care | Attending: Family Medicine | Admitting: Family Medicine

## 2015-11-23 DIAGNOSIS — J111 Influenza due to unidentified influenza virus with other respiratory manifestations: Secondary | ICD-10-CM

## 2015-11-23 MED ORDER — IPRATROPIUM BROMIDE 0.06 % NA SOLN: 2.0000 | Freq: Four times a day (QID) | NASAL | Status: DC

## 2015-11-23 MED ORDER — AZITHROMYCIN 250 MG PO TABS: ORAL_TABLET | ORAL | Status: DC

## 2015-11-23 MED FILL — IPRATROPIUM 0.06% SPRAY: 0.06 | 30 days supply | Qty: 15 | Fill #0

## 2015-11-23 MED FILL — ?AZITHROMYCIN 250 MG TABLET: 250 MG | 5 days supply | Qty: 6 | Fill #0

## 2015-11-23 NOTE — ED Notes (Signed)
Pt  Reports  Symptoms  Of  Cough       Congestion        X  sev  Weeks        Pt  Reports  At  First  It  Was       Productive             He  reports  A  Scratchy  Throat           -          Pt   Sitting  Upright  On  Exam table  Speaking in  Complete  sentances

## 2015-11-23 NOTE — ED Provider Notes (Signed)
CSN: [id number]     Arrival date & time 11/23/15  1301 History   First MD Initiated Contact with Patient 11/23/15 1325     Chief Complaint  Patient presents with  . URI   (Consider location/radiation/quality/duration/timing/severity/associated sxs/prior Treatment) Patient is a 40 y.o. male presenting with URI. The history is provided by the patient and the spouse.  URI Presenting symptoms: congestion, cough and sore throat   Presenting symptoms: no fever and no rhinorrhea   Severity:  Mild Onset quality:  Gradual Duration:  2 weeks Chronicity:  New Ineffective treatments:  OTC medications Associated symptoms: no wheezing   Risk factors: sick contacts     Past Medical History  Diagnosis Date  . Hyperlipidemia    History reviewed. No pertinent past surgical history. History reviewed. No pertinent family history. Social History  Substance Use Topics  . Smoking status: Never Smoker   . Smokeless tobacco: None  . Alcohol Use: No    Review of Systems  Constitutional: Negative.  Negative for fever.  HENT: Positive for congestion and sore throat. Negative for postnasal drip and rhinorrhea.   Respiratory: Positive for cough. Negative for shortness of breath and wheezing.   Cardiovascular: Negative.   Gastrointestinal: Negative.   All other systems reviewed and are negative.   Allergies  Review of patient's allergies indicates no known allergies.  Home Medications   Prior to Admission medications   Medication Sig Start Date End Date Taking? Authorizing Provider  albuterol (PROVENTIL HFA;VENTOLIN HFA) 108 (90 BASE) MCG/ACT inhaler Inhale into the lungs every 6 (six) hours as needed for wheezing or shortness of breath.    Historical Provider, MD  azithromycin (ZITHROMAX Z-PAK) 250 MG tablet Take as directed on pack 11/23/15   Linna Hoff, MD  cetirizine (ZYRTEC) 10 MG tablet Take 1 tablet (10 mg total) by mouth daily. 03/28/15   Massie Maroon, FNP  diphenhydrAMINE  (BENADRYL) 25 MG tablet Take 2 tablets (50 mg total) by mouth every 4 (four) hours as needed for itching. Patient not taking: Reported on 03/28/2015 03/12/15   Arby Barrette, MD  EPINEPHrine (EPIPEN 2-PAK) 0.3 mg/0.3 mL IJ SOAJ injection Inject 0.3 mLs (0.3 mg total) into the muscle once. Patient not taking: Reported on 04/19/2015 03/28/15   Massie Maroon, FNP  famotidine (PEPCID) 20 MG tablet Take 1 tablet (20 mg total) by mouth 2 (two) times daily. Patient not taking: Reported on 03/28/2015 03/12/15   Arby Barrette, MD  ipratropium (ATROVENT) 0.06 % nasal spray Place 2 sprays into both nostrils 4 (four) times daily. 11/23/15   Linna Hoff, MD  predniSONE (DELTASONE) 20 MG tablet 3 tabs po day one, then 2 tabs daily x 4 days Patient not taking: Reported on 03/28/2015 03/12/15   Arby Barrette, MD  triamcinolone cream (KENALOG) 0.1 % Apply 1 application topically 2 (two) times daily. 03/28/15   Massie Maroon, FNP   Meds Ordered and Administered this Visit  Medications - No data to display  BP 147/84 mmHg  Pulse 95  Temp(Src) 98.1 F (36.7 C) (Oral)  Resp 18  SpO2 96% No data found.   Physical Exam  Constitutional: He is oriented to person, place, and time. He appears well-developed and well-nourished.  HENT:  Right Ear: External ear normal.  Left Ear: External ear normal.  Mouth/Throat: Oropharynx is clear and moist.  Neck: Normal range of motion. Neck supple.  Cardiovascular: Normal rate, regular rhythm, normal heart sounds and intact distal pulses.   Pulmonary/Chest:  Effort normal and breath sounds normal. No respiratory distress. He has no wheezes.  Lymphadenopathy:    He has no cervical adenopathy.  Neurological: He is alert and oriented to person, place, and time.  Skin: Skin is warm and dry.  Nursing note and vitals reviewed.   ED Course  Procedures (including critical care time)  Labs Review Labs Reviewed - No data to display  Imaging Review Dg Chest 2  View  11/23/2015  CLINICAL DATA:  Cough for 2 weeks EXAM: CHEST  2 VIEW COMPARISON:  10/25/2012 FINDINGS: Cardiomediastinal silhouette is stable. No acute infiltrate or pleural effusion. No pulmonary edema. Bony thorax is unremarkable. IMPRESSION: No active cardiopulmonary disease. Electronically Signed   By: Natasha MeadLiviu  Pop M.D.   On: 11/23/2015 14:02   X-rays reviewed and report per radiologist.   Visual Acuity Review  Right Eye Distance:   Left Eye Distance:   Bilateral Distance:    Right Eye Near:   Left Eye Near:    Bilateral Near:         MDM   1. Bronchitis with flu        Linna HoffJames D Able Malloy, MD 11/23/15 1418

## 2015-12-18 ENCOUNTER — Ambulatory Visit: Payer: No Typology Code available for payment source | Admitting: Family Medicine

## 2017-02-04 IMAGING — DX DG CHEST 2V
2 series · 2 of 2 positions shown · non-contrast
Comparison: 10/25/2012

CLINICAL DATA: Cough for 2 weeks

EXAM:
CHEST  2 VIEW

[chest pa]
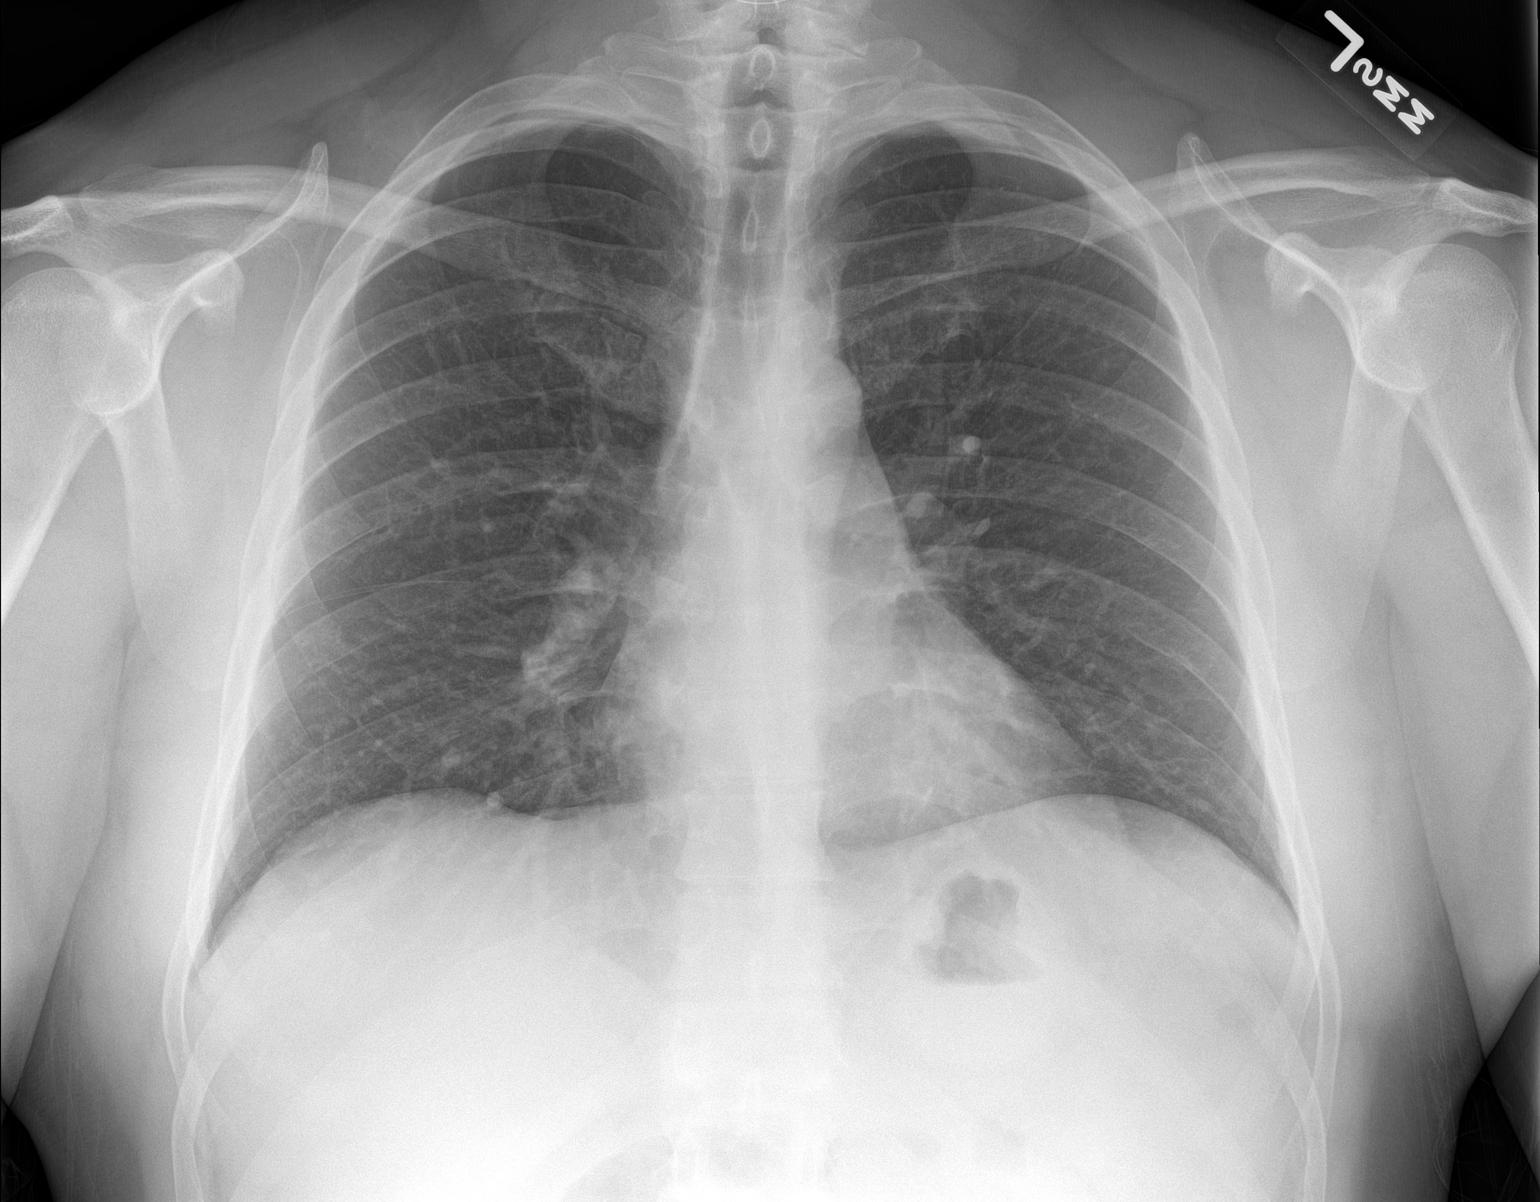

[chest lat]
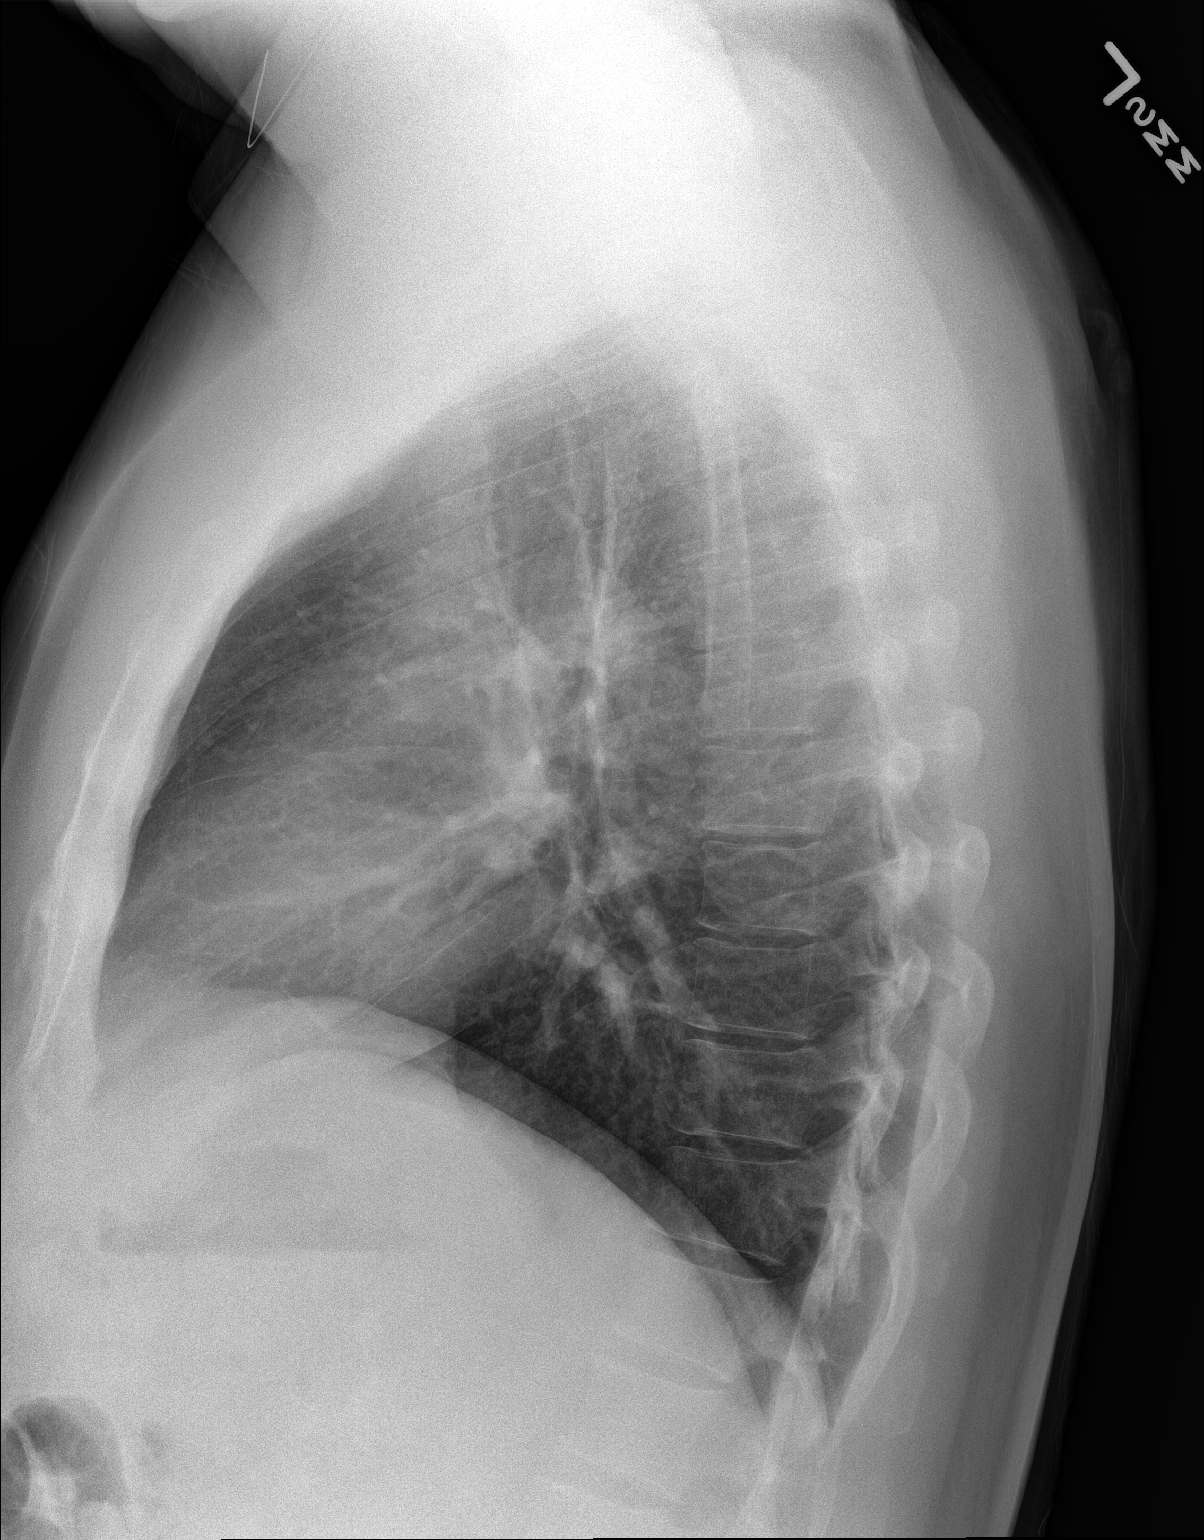

[2 of 2 positions shown; findings below may reference images not displayed]

FINDINGS: Cardiomediastinal silhouette is stable. No acute infiltrate or
pleural effusion. No pulmonary edema. Bony thorax is unremarkable.
IMPRESSION: No active cardiopulmonary disease.

## 2018-12-09 ENCOUNTER — Ambulatory Visit (INDEPENDENT_AMBULATORY_CARE_PROVIDER_SITE_OTHER): Payer: Self-pay | Admitting: Family Medicine

## 2019-01-04 ENCOUNTER — Encounter (INDEPENDENT_AMBULATORY_CARE_PROVIDER_SITE_OTHER): Payer: Self-pay | Admitting: Primary Care

## 2019-01-04 ENCOUNTER — Other Ambulatory Visit: Payer: Self-pay

## 2019-01-04 ENCOUNTER — Ambulatory Visit (INDEPENDENT_AMBULATORY_CARE_PROVIDER_SITE_OTHER): Payer: Self-pay | Admitting: Primary Care

## 2019-01-04 VITALS — BP 114/73 | HR 117 | Temp 97.8°F | Ht 62.5 in | Wt 180.6 lb

## 2019-01-04 DIAGNOSIS — R7303 Prediabetes: Secondary | ICD-10-CM

## 2019-01-04 DIAGNOSIS — L309 Dermatitis, unspecified: Secondary | ICD-10-CM

## 2019-01-04 DIAGNOSIS — Z23 Encounter for immunization: Secondary | ICD-10-CM

## 2019-01-04 DIAGNOSIS — E785 Hyperlipidemia, unspecified: Secondary | ICD-10-CM

## 2019-01-04 LAB — POCT GLYCOSYLATED HEMOGLOBIN (HGB A1C): HEMOGLOBIN A1C: 5.3 % (ref 4.0–5.6)

## 2019-01-04 NOTE — Patient Instructions (Addendum)
Contact Dermatitis  Dermatitis is redness, soreness, and swelling (inflammation) of the skin. Contact dermatitis is a reaction to certain substances that touch the skin. Many different substances can cause contact dermatitis. There are two types of contact dermatitis:   Irritant contact dermatitis. This type is caused by something that irritates your skin, such as having dry hands from washing them too often with soap. This type does not require previous exposure to the substance for a reaction to occur. This is the most common type.   Allergic contact dermatitis. This type is caused by a substance that you are allergic to, such as poison ivy. This type occurs when you have been exposed to the substance (allergen) and develop a sensitivity to it. Dermatitis may develop soon after your first exposure to the allergen, or it may not develop until the next time you are exposed and every time thereafter.  What are the causes?  Irritant contact dermatitis is most commonly caused by exposure to:   Makeup.   Soaps.   Detergents.   Bleaches.   Acids.   Metal salts, such as nickel.  Allergic contact dermatitis is most commonly caused by exposure to:   Poisonous plants.   Chemicals.   Jewelry.   Latex.   Medicines.   Preservatives in products, such as clothing.  What increases the risk?  You are more likely to develop this condition if you have:   A job that exposes you to irritants or allergens.   Certain medical conditions, such as asthma or eczema.  What are the signs or symptoms?  Symptoms of this condition may occur on your body anywhere the irritant has touched you or is touched by you.   Symptoms include:  ? Dryness or flaking.  ? Redness.  ? Cracks.  ? Itching.  ? Pain or a burning feeling.  ? Blisters.  ? Drainage of small amounts of blood or clear fluid from skin cracks.  With allergic contact dermatitis, there may also be swelling in areas such as the eyelids, mouth, or genitals.  How is this  diagnosed?  This condition is diagnosed with a medical history and physical exam.   A patch skin test may be performed to help determine the cause.   If the condition is related to your job, you may need to see an occupational medicine specialist.  How is this treated?  This condition is treated by checking for the cause of the reaction and protecting your skin from further contact. Treatment may also include:   Steroid creams or ointments. Oral steroid medicines may be needed in more severe cases.   Antibiotic medicines or antibacterial ointments, if a skin infection is present.   Antihistamine lotion or an antihistamine taken by mouth to ease itching.   A bandage (dressing).  Follow these instructions at home:  Skin care   Moisturize your skin as needed.   Apply cool compresses to the affected areas.   Try applying baking soda paste to your skin. Stir water into baking soda until it reaches a paste-like consistency.   Do not scratch your skin, and avoid friction to the affected area.   Avoid the use of soaps, perfumes, and dyes.  Medicines   Take or apply over-the-counter and prescription medicines only as told by your health care provider.   If you were prescribed an antibiotic medicine, take or apply the antibiotic as told by your health care provider. Do not stop using the antibiotic even if your condition   improves.  Bathing   Try taking a bath with:  ? Epsom salts. Follow the instructions on the packaging. You can get these at your local pharmacy or grocery store.  ? Baking soda. Pour a small amount into the bath as directed by your health care provider.  ? Colloidal oatmeal. Follow the instructions on the packaging. You can get this at your local pharmacy or grocery store.   Bathe less frequently, such as every other day.   Bathe in lukewarm water. Avoid using hot water.  Bandage care   If you were given a bandage (dressing), change it as told by your health care provider.   Wash your hands  with soap and water before and after you change your dressing. If soap and water are not available, use hand sanitizer.  General instructions   Avoid the substance that caused your reaction. If you do not know what caused it, keep a journal to try to track what caused it. Write down:  ? What you eat.  ? What cosmetic products you use.  ? What you drink.  ? What you wear in the affected area. This includes jewelry.   Check the affected areas every day for signs of infection. Check for:  ? More redness, swelling, or pain.  ? More fluid or blood.  ? Warmth.  ? Pus or a bad smell.   Keep all follow-up visits as told by your health care provider. This is important.  Contact a health care provider if:   Your condition does not improve with treatment.   Your condition gets worse.   You have signs of infection such as swelling, tenderness, redness, soreness, or warmth in the affected area.   You have a fever.   You have new symptoms.  Get help right away if:   You have a severe headache, neck pain, or neck stiffness.   You vomit.   You feel very sleepy.   You notice red streaks coming from the affected area.   Your bone or joint underneath the affected area becomes painful after the skin has healed.   The affected area turns darker.   You have difficulty breathing.  Summary   Dermatitis is redness, soreness, and swelling (inflammation) of the skin. Contact dermatitis is a reaction to certain substances that touch the skin.   Symptoms of this condition may occur on your body anywhere the irritant has touched you or is touched by you.   This condition is treated by figuring out what caused the reaction and protecting your skin from further contact. Treatment may also include medicines and skin care.   Avoid the substance that caused your reaction. If you do not know what caused it, keep a journal to try to track what caused it.   Contact a health care provider if your condition gets worse or you have signs  of infection such as swelling, tenderness, redness, soreness, or warmth in the affected area.  This information is not intended to replace advice given to you by your health care provider. Make sure you discuss any questions you have with your health care provider.  Document Released: 10/24/2000 Document Revised: 05/12/2018 Document Reviewed: 05/12/2018  [employer] Interactive Patient Education  2019 [employer]

## 2019-01-04 NOTE — Progress Notes (Signed)
New Patient Office Visit  Subjective:  Patient ID: Warren Owens, male    DOB: 01-05-1976  Age: 43 y.o. MRN: [id number]  CC:  Chief Complaint  Patient presents with  . New Patient (Initial Visit)    dry/itchy skin in pelvic area and underarms    HPI Warren Owens presents to establish care. Patient has a history of allergies relieved by nasal spray and zyrtec as needed. Patient has concerns about a rash located under arms and groin area. Also on left lower leg eczema patch.    Social History   Socioeconomic History  . Marital status: Married    Spouse name: Not on file  . Number of children: Not on file  . Years of education: Not on file  . Highest education level: Not on file  Occupational History  . Not on file  Social Needs  . Financial resource strain: Not on file  . Food insecurity:    Worry: Not on file    Inability: Not on file  . Transportation needs:    Medical: Not on file    Non-medical: Not on file  Tobacco Use  . Smoking status: Never Smoker  . Smokeless tobacco: Never Used  Substance and Sexual Activity  . Alcohol use: No  . Drug use: No  . Sexual activity: Yes  Lifestyle  . Physical activity:    Days per week: Not on file    Minutes per session: Not on file  . Stress: Not on file  Relationships  . Social connections:    Talks on phone: Not on file    Gets together: Not on file    Attends religious service: Not on file    Active member of club or organization: Not on file    Attends meetings of clubs or organizations: Not on file    Relationship status: Not on file  . Intimate partner violence:    Fear of current or ex partner: Not on file    Emotionally abused: Not on file    Physically abused: Not on file    Forced sexual activity: Not on file  Other Topics Concern  . Not on file  Social History Narrative  . Not on file    ROS Review of Systems  Constitutional: Negative.   HENT: Negative.   Eyes:  Negative.   Respiratory: Negative.   Cardiovascular: Negative.   Gastrointestinal: Negative.   Endocrine: Negative.   Genitourinary: Negative.   Musculoskeletal: Negative.   Skin: Positive for rash.  Allergic/Immunologic: Positive for environmental allergies.  Neurological: Negative.   Psychiatric/Behavioral: Negative.     Objective:   Today's Vitals: BP 114/73 (BP Location: Left Arm, Patient Position: Sitting, Cuff Size: Normal)   Pulse (!) 117   Temp 97.8 F (36.6 C) (Oral)   Ht 5' 2.5" (1.588 m)   Wt 180 lb 9.6 oz (81.9 kg)   SpO2 94%   BMI 32.51 kg/m   Physical Exam HENT:     Head: Normocephalic.     Right Ear: Tympanic membrane normal.     Nose: Rhinorrhea present.     Mouth/Throat:     Mouth: Mucous membranes are moist.  Eyes:     Extraocular Movements: Extraocular movements intact.     Pupils: Pupils are equal, round, and reactive to light.  Neck:     Musculoskeletal: Normal range of motion and neck supple.  Cardiovascular:     Rate and Rhythm: Normal rate and regular rhythm.  Pulses: Normal pulses.     Heart sounds: Normal heart sounds.  Pulmonary:     Effort: Pulmonary effort is normal.     Breath sounds: Normal breath sounds.  Abdominal:     General: Bowel sounds are normal.     Palpations: Abdomen is soft.  Musculoskeletal: Normal range of motion.  Skin:    General: Skin is warm and dry.  Neurological:     Mental Status: He is alert and oriented to person, place, and time.  Psychiatric:        Mood and Affect: Mood normal.     Assessment & Plan:  Warren Owens was seen today for new patient (initial visit).  Diagnoses and all orders for this visit:  Prediabetes -     HgB A1c  5.4 resolved   Need for immunization against influenza -     Flu Vaccine QUAD 36+ mos IM  Dermatitis (contract) probable from wife changing of detergent   Dyslipidemia rtn for FLP  Eczema, unspecified type use emollient lotion   Problem List Items Addressed This  Visit    Prediabetes - Primary   Relevant Orders   HgB A1c (Completed)    Other Visit Diagnoses    Need for immunization against influenza       Relevant Orders   Flu Vaccine QUAD 36+ mos IM (Completed)      Outpatient Encounter Medications as of 01/04/2019  Medication Sig  . albuterol (PROVENTIL HFA;VENTOLIN HFA) 108 (90 BASE) MCG/ACT inhaler Inhale into the lungs every 6 (six) hours as needed for wheezing or shortness of breath.  . EPINEPHrine (EPIPEN 2-PAK) 0.3 mg/0.3 mL IJ SOAJ injection Inject 0.3 mLs (0.3 mg total) into the muscle once. (Patient not taking: Reported on 04/19/2015)  . [DISCONTINUED] azithromycin (ZITHROMAX Z-PAK) 250 MG tablet Take as directed on pack  . [DISCONTINUED] cetirizine (ZYRTEC) 10 MG tablet Take 1 tablet (10 mg total) by mouth daily.  . [DISCONTINUED] diphenhydrAMINE (BENADRYL) 25 MG tablet Take 2 tablets (50 mg total) by mouth every 4 (four) hours as needed for itching. (Patient not taking: Reported on 03/28/2015)  . [DISCONTINUED] famotidine (PEPCID) 20 MG tablet Take 1 tablet (20 mg total) by mouth 2 (two) times daily. (Patient not taking: Reported on 03/28/2015)  . [DISCONTINUED] ipratropium (ATROVENT) 0.06 % nasal spray Place 2 sprays into both nostrils 4 (four) times daily.  . [DISCONTINUED] predniSONE (DELTASONE) 20 MG tablet 3 tabs po day one, then 2 tabs daily x 4 days (Patient not taking: Reported on 03/28/2015)  . [DISCONTINUED] triamcinolone cream (KENALOG) 0.1 % Apply 1 application topically 2 (two) times daily.   No facility-administered encounter medications on file as of 01/04/2019.     Follow-up: No follow-ups on file.   [name], NP

## 2019-07-19 ENCOUNTER — Ambulatory Visit: Payer: Self-pay

## 2019-09-26 ENCOUNTER — Ambulatory Visit: Payer: Self-pay

## 2020-11-17 ENCOUNTER — Other Ambulatory Visit: Payer: Self-pay

## 2021-05-29 ENCOUNTER — Emergency Department (HOSPITAL_COMMUNITY)
Admission: EM | Admit: 2021-05-29 | Discharge: 2021-05-29 | Disposition: A | Discharge status: Home / Self Care | Payer: Self-pay | Attending: Emergency Medicine | Admitting: Emergency Medicine

## 2021-05-29 ENCOUNTER — Encounter (HOSPITAL_COMMUNITY): Payer: Self-pay | Admitting: Emergency Medicine

## 2021-05-29 ENCOUNTER — Emergency Department (HOSPITAL_COMMUNITY): Payer: Self-pay

## 2021-05-29 DIAGNOSIS — R072 Precordial pain: Secondary | ICD-10-CM | POA: Insufficient documentation

## 2021-05-29 LAB — BASIC METABOLIC PANEL
Anion gap: 8 (ref 5–15)
BUN: 11 mg/dL (ref 6–20)
CO2: 25 mmol/L (ref 22–32)
Calcium: 9.1 mg/dL (ref 8.9–10.3)
Chloride: 103 mmol/L (ref 98–111)
Creatinine, Ser: 0.95 mg/dL (ref 0.61–1.24)
GFR, Estimated: >60 mL/min (ref >60)
Glucose, Bld: 100 mg/dL — ABNORMAL HIGH (ref 70–99)
Potassium: 3.8 mmol/L (ref 3.5–5.1)
Sodium: 136 mmol/L (ref 135–145)

## 2021-05-29 LAB — CBC
HCT: 45.6 % (ref 39.0–52.0)
Hemoglobin: 15.7 g/dL (ref 13.0–17.0)
MCH: 30.7 pg (ref 26.0–34.0)
MCHC: 34.4 g/dL (ref 30.0–36.0)
MCV: 89.1 fL (ref 80.0–100.0)
Platelets: 293 10*3/uL (ref 150–400)
RBC: 5.12 MIL/uL (ref 4.22–5.81)
RDW: 11.7 % (ref 11.5–15.5)
WBC: 9.4 10*3/uL (ref 4.0–10.5)
nRBC: 0 % (ref 0.0–0.2)

## 2021-05-29 LAB — TROPONIN I (HIGH SENSITIVITY)
Troponin I (High Sensitivity): 11 ng/L (ref <18)
Troponin I (High Sensitivity): 10 ng/L (ref <18)

## 2021-05-29 NOTE — ED Provider Notes (Signed)
[facility] EMERGENCY DEPARTMENT Provider Note   CSN: [id number] Arrival date & time: 05/29/21  1115     History Chief Complaint  Patient presents with   Chest Pain    Leighton Brickley is a 45 y.o. male with a history of hyperlipidemia.  Patient presents with a chief complaint of chest pain.  Patient states that he had 2 episodes of chest pain today.  First episode of chest pain occurred approximately 0500-0600, second episode occurred 1100-11:30.  Patient states both episodes had pain to the left side of the chest.  Pain was described as "pinching."  2/10 on the pain scale.  Pain lasted for approximately 10 seconds.  Denies any associated symptoms of nausea, vomiting, shortness of breath, diaphoresis..  Denies and alleviating or aggravating factors.    Patient leg swelling or tenderness, palpitations, hemoptysis, history of DVT or PE, surgery last 12 weeks, cancer treatment, hormone therapy, smoking in the last 90 days, family history of MI in individual less than 18 years old.   Chest Pain Associated symptoms: no abdominal pain, no back pain, no cough, no dizziness, no fever, no headache, no nausea, no palpitations, no shortness of breath and no vomiting        Past Medical History:  Diagnosis Date   Hyperlipidemia     Patient Active Problem List   Diagnosis Date Noted   Eczema of lower leg 01/04/2019   Multiple food allergies 03/28/2015   Allergic dermatitis 03/28/2015   Rhinitis, allergic 01/10/2015   Dermatitis 01/10/2015   Prediabetes 10/13/2014   Dyslipidemia 10/12/2014    No past surgical history on file.     No family history on file.  Social History   Tobacco Use   Smoking status: Never   Smokeless tobacco: Never  Substance Use Topics   Alcohol use: No   Drug use: No    Home Medications Prior to Admission medications   Medication Sig Start Date End Date Taking? Authorizing Provider  albuterol (PROVENTIL HFA;VENTOLIN HFA)  108 (90 BASE) MCG/ACT inhaler Inhale into the lungs every 6 (six) hours as needed for wheezing or shortness of breath.    [provider]  EPINEPHrine (EPIPEN 2-PAK) 0.3 mg/0.3 mL IJ SOAJ injection Inject 0.3 mLs (0.3 mg total) into the muscle once. Patient not taking: Reported on 04/19/2015 03/28/15   Massie Maroon, FNP    Allergies    Shellfish allergy  Review of Systems   Review of Systems  Constitutional:  Negative for chills and fever.  Eyes:  Negative for visual disturbance.  Respiratory:  Negative for cough and shortness of breath.   Cardiovascular:  Positive for chest pain. Negative for palpitations and leg swelling.  Gastrointestinal:  Negative for abdominal pain, nausea and vomiting.  Genitourinary:  Negative for difficulty urinating and dysuria.  Musculoskeletal:  Negative for back pain and neck pain.  Skin:  Negative for color change and rash.  Neurological:  Negative for dizziness, syncope, light-headedness and headaches.  Psychiatric/Behavioral:  Negative for confusion.    Physical Exam Updated Vital Signs BP 118/89   Pulse 76   Temp 98.2 F (36.8 C) (Oral)   Resp 14   SpO2 98%   Physical Exam Vitals and nursing note reviewed.  Constitutional:      General: He is not in acute distress.    Appearance: He is not ill-appearing, toxic-appearing or diaphoretic.  HENT:     Head: Normocephalic.  Eyes:     General: No scleral icterus.  Right eye: No discharge.        Left eye: No discharge.  Cardiovascular:     Rate and Rhythm: Normal rate.     Pulses:          Radial pulses are 2+ on the right side and 2+ on the left side.     Heart sounds: Normal heart sounds, S1 normal and S2 normal.  Pulmonary:     Effort: Pulmonary effort is normal. No tachypnea, bradypnea or respiratory distress.     Breath sounds: Normal breath sounds. No stridor.  Abdominal:     General: Bowel sounds are normal. There is no distension. There are no signs of injury.      Palpations: Abdomen is soft. There is no mass or pulsatile mass.     Tenderness: There is no abdominal tenderness. There is no guarding or rebound.     Hernia: There is no hernia in the umbilical area or ventral area.  Musculoskeletal:     Cervical back: Neck supple.     Right lower leg: No swelling or tenderness. No edema.     Left lower leg: No swelling or tenderness. No edema.  Skin:    General: Skin is warm and dry.  Neurological:     General: No focal deficit present.     Mental Status: He is alert.  Psychiatric:        Behavior: Behavior is cooperative.    ED Results / Procedures / Treatments   Labs (all labs ordered are listed, but only abnormal results are displayed) Labs Reviewed  BASIC METABOLIC PANEL - Abnormal; Notable for the following components:      Result Value   Glucose, Bld 100 (*)    All other components within normal limits  CBC  TROPONIN I (HIGH SENSITIVITY)  TROPONIN I (HIGH SENSITIVITY)    EKG None  Radiology DG Chest 2 View  Result Date: 05/29/2021 CLINICAL DATA:  Left sided chest pain Non smoker EXAM: CHEST - 2 VIEW COMPARISON:  11/23/2015 FINDINGS: Lungs are clear. Heart size and mediastinal contours are within normal limits. No effusion.  No pneumothorax. Visualized bones unremarkable. IMPRESSION: No acute cardiopulmonary disease. Electronically Signed   By: Corlis Leak M.D.   On: 05/29/2021 12:20    Procedures Procedures   Medications Ordered in ED Medications - No data to display  ED Course  I have reviewed the triage vital signs and the nursing notes.  Pertinent labs & imaging results that were available during my care of the patient were reviewed by me and considered in my medical decision making (see chart for details).    MDM Rules/Calculators/A&P                           Alert 45 year old male no acute distress, nontoxic-appearing.  Presents with a chief complaint of chest pain.  Patient had 2 episodes of chest pain each lasting  approximately 10 seconds.  No chest pain at present  ACS work-up was obtained while patient was in triage.  EKG shows sinus rhythm with sinus arrhythmia Chest x-ray shows no active cardiopulmonary disease Troponin 11 and 10 with delta of -1 Heart score of 1; low suspicion of ACS at this time.  We will have patient follow-up with [place] [facility] clinic.  Low suspicion for PE as patient can be ruled out by [facility]  Patient given strict return precautions.  Patient expressed understanding of all instructions and is  agreeable with this plan.  Final Clinical Impression(s) / ED Diagnoses Final diagnoses:  Precordial chest pain    Rx / DC Orders ED Discharge Orders     None        Berneice Heinrich 05/29/21 1741    Charlynne Pander, MD 05/29/21 2035

## 2021-05-29 NOTE — ED Triage Notes (Signed)
Patient complains of left sided chest "pinching that started when he woke up this morning. Patient denies shortness of breath, nausea, vomiting. Patient alert, oriented, and in no apparent distress at this time.

## 2021-05-29 NOTE — Discharge Instructions (Addendum)
You came to the emergency room today to be evaluated for your chest pain.  Your physical exam, chest x-ray, EKG, and lab work were reassuring you are not having acute heart attack at this time.  You will need to follow-up with the [place] [facility] clinic.  Please call them tomorrow to schedule an appointment.   Get help right away if: Your chest pain gets worse. You have a cough that gets worse, or you cough up blood. You have severe pain in your abdomen. You faint. You have sudden, unexplained chest discomfort. You have sudden, unexplained discomfort in your arms, back, neck, or jaw. You have shortness of breath at any time. You suddenly start to sweat, or your skin gets clammy. You feel nausea or you vomit. You suddenly feel lightheaded or dizzy. You have severe weakness, or unexplained weakness or fatigue. Your heart begins to beat quickly, or it feels like it is skipping beats.

## 2021-05-31 ENCOUNTER — Telehealth: Payer: Self-pay | Admitting: Nurse Practitioner

## 2021-05-31 NOTE — Telephone Encounter (Signed)
Called pt to see if was doing better if they had already ben able to get an appt with pcp if not offer to be seen with [place] at the clinic pt stated was at work right now so they would need to call us back to make that appt I proved the clinic number they took number and hung up

## 2021-07-10 ENCOUNTER — Other Ambulatory Visit: Payer: Self-pay

## 2021-07-10 ENCOUNTER — Ambulatory Visit: Payer: Self-pay | Admitting: Physician Assistant

## 2021-07-10 VITALS — BP 120/88 | HR 89 | Temp 98.6°F | Resp 18 | Ht 63.5 in | Wt 190.0 lb

## 2021-07-10 DIAGNOSIS — E785 Hyperlipidemia, unspecified: Secondary | ICD-10-CM

## 2021-07-10 DIAGNOSIS — Z1159 Encounter for screening for other viral diseases: Secondary | ICD-10-CM

## 2021-07-10 DIAGNOSIS — Z114 Encounter for screening for human immunodeficiency virus [HIV]: Secondary | ICD-10-CM

## 2021-07-10 DIAGNOSIS — K5909 Other constipation: Secondary | ICD-10-CM

## 2021-07-10 DIAGNOSIS — K219 Gastro-esophageal reflux disease without esophagitis: Secondary | ICD-10-CM

## 2021-07-10 NOTE — Progress Notes (Signed)
Patient has had hot chocolate. Patient has not taken medication today. Patient denies pain at this time except for a few days of abdominal discomfort. Patient reports BM's with small balls instead of full movements in the pat 2 weeks.

## 2021-07-10 NOTE — Patient Instructions (Signed)
To help with your abdominal pain, I encourage you to start taking omeprazole 20 mg once a day for at least the next 14 days.  You will purchase this over-the-counter.  To help with your constipation, I encourage you to increase your water intake to at least 5 bottles of water a day, you can also use MiraLAX as directed.  You will purchase the MiraLAX over-the-counter.  Please return to the mobile unit in the morning for fasting labs to be completed.  Roney Jaffe, PA-C Physician Assistant [facility] [email or web address]   Constipation, Adult Constipation is when a person has fewer than three bowel movements in a week, has difficulty having a bowel movement, or has stools (feces) that are dry, hard, or larger than normal. Constipation may be caused by an underlying condition. It may become worse with age if a person takes certain medicines and does not take in enough fluids. Follow these instructions at home: Eating and drinking  Eat foods that have a lot of fiber, such as beans, whole grains, and fresh fruits and vegetables. Limit foods that are low in fiber and high in fat and processed sugars, such as fried or sweet foods. These include french fries, hamburgers, cookies, candies, and soda. Drink enough fluid to keep your urine pale yellow. General instructions Exercise regularly or as told by your health care provider. Try to do 150 minutes of moderate exercise each week. Use the bathroom when you have the urge to go. Do not hold it in. Take over-the-counter and prescription medicines only as told by your health care provider. This includes any fiber supplements. During bowel movements: Practice deep breathing while relaxing the lower abdomen. Practice pelvic floor relaxation. Watch your condition for any changes. Let your health care provider know about them. Keep all follow-up visits as told by your health care provider. This is  important. Contact a health care provider if: You have pain that gets worse. You have a fever. You do not have a bowel movement after 4 days. You vomit. You are not hungry or you lose weight. You are bleeding from the opening between the buttocks (anus). You have thin, pencil-like stools. Get help right away if: You have a fever and your symptoms suddenly get worse. You leak stool or have blood in your stool. Your abdomen is bloated. You have severe pain in your abdomen. You feel dizzy or you faint. Summary Constipation is when a person has fewer than three bowel movements in a week, has difficulty having a bowel movement, or has stools (feces) that are dry, hard, or larger than normal. Eat foods that have a lot of fiber, such as beans, whole grains, and fresh fruits and vegetables. Drink enough fluid to keep your urine pale yellow. Take over-the-counter and prescription medicines only as told by your health care provider. This includes any fiber supplements. This information is not intended to replace advice given to you by your health care provider. Make sure you discuss any questions you have with your health care provider. Document Revised: 09/14/2019 Document Reviewed: 09/14/2019 Elsevier Patient Education  2022 Else[employer] Inc.  Gastroesophageal Reflux Disease, Adult Gastroesophageal reflux (GER) happens when acid from the stomach flows up into the tube that connects the mouth and the stomach (esophagus). Normally, food travels down the esophagus and stays in the stomach to be digested. However, when a person has GER, food and stomach acid sometimes move back up into the esophagus. If this becomes a more serious problem,  the person may be diagnosed with a disease called gastroesophageal reflux disease (GERD). GERD occurs when the reflux: Happens often. Causes frequent or severe symptoms. Causes problems such as damage to the esophagus. When stomach acid comes in contact with the  esophagus, the acid may cause inflammation in the esophagus. Over time, GERD may create small holes (ulcers) in the lining of the esophagus. What are the causes? This condition is caused by a problem with the muscle between the esophagus and the stomach (lower esophageal sphincter, or LES). Normally, the LES muscle closes after food passes through the esophagus to the stomach. When the LES is weakened or abnormal, it does not close properly, and that allows food and stomach acid to go back up into the esophagus. The LES can be weakened by certain dietary substances, medicines, and medical conditions, including: Tobacco use. Pregnancy. Having a hiatal hernia. Alcohol use. Certain foods and beverages, such as coffee, chocolate, onions, and peppermint. What increases the risk? You are more likely to develop this condition if you: Have an increased body weight. Have a connective tissue disorder. Take NSAIDs, such as ibuprofen. What are the signs or symptoms? Symptoms of this condition include: Heartburn. Difficult or painful swallowing and the feeling of having a lump in the throat. A bitter taste in the mouth. Bad breath and having a large amount of saliva. Having an upset or bloated stomach and belching. Chest pain. Different conditions can cause chest pain. Make sure you see your health care provider if you experience chest pain. Shortness of breath or wheezing. Ongoing (chronic) cough or a nighttime cough. Wearing away of tooth enamel. Weight loss. How is this diagnosed? This condition may be diagnosed based on a medical history and a physical exam. To determine if you have mild or severe GERD, your health care provider may also monitor how you respond to treatment. You may also have tests, including: A test to examine your stomach and esophagus with a small camera (endoscopy). A test that measures the acidity level in your esophagus. A test that measures how much pressure is on your  esophagus. A barium swallow or modified barium swallow test to show the shape, size, and functioning of your esophagus. How is this treated? Treatment for this condition may vary depending on how severe your symptoms are. Your health care provider may recommend: Changes to your diet. Medicine. Surgery. The goal of treatment is to help relieve your symptoms and to prevent complications. Follow these instructions at home: Eating and drinking  Follow a diet as recommended by your health care provider. This may involve avoiding foods and drinks such as: Coffee and tea, with or without caffeine. Drinks that contain alcohol. Energy drinks and sports drinks. Carbonated drinks or sodas. Chocolate and cocoa. Peppermint and mint flavorings. Garlic and onions. Horseradish. Spicy and acidic foods, including peppers, chili powder, curry powder, vinegar, hot sauces, and barbecue sauce. Citrus fruit juices and citrus fruits, such as oranges, lemons, and limes. Tomato-based foods, such as red sauce, chili, salsa, and pizza with red sauce. Fried and fatty foods, such as donuts, french fries, potato chips, and high-fat dressings. High-fat meats, such as hot dogs and fatty cuts of red and white meats, such as rib eye steak, sausage, ham, and bacon. High-fat dairy items, such as whole milk, butter, and cream cheese. Eat small, frequent meals instead of large meals. Avoid drinking large amounts of liquid with your meals. Avoid eating meals during the 2-3 hours before bedtime. Avoid lying down  right after you eat. Do not exercise right after you eat. Lifestyle  Do not use any products that contain nicotine or tobacco. These products include cigarettes, chewing tobacco, and vaping devices, such as e-cigarettes. If you need help quitting, ask your health care provider. Try to reduce your stress by using methods such as yoga or meditation. If you need help reducing stress, ask your health care provider. If  you are overweight, reduce your weight to an amount that is healthy for you. Ask your health care provider for guidance about a safe weight loss goal. General instructions Pay attention to any changes in your symptoms. Take over-the-counter and prescription medicines only as told by your health care provider. Do not take aspirin, ibuprofen, or other NSAIDs unless your health care provider told you to take these medicines. Wear loose-fitting clothing. Do not wear anything tight around your waist that causes pressure on your abdomen. Raise (elevate) the head of your bed about 6 inches (15 cm). You can use a wedge to do this. Avoid bending over if this makes your symptoms worse. Keep all follow-up visits. This is important. Contact a health care provider if: You have: New symptoms. Unexplained weight loss. Difficulty swallowing or it hurts to swallow. Wheezing or a persistent cough. A hoarse voice. Your symptoms do not improve with treatment. Get help right away if: You have sudden pain in your arms, neck, jaw, teeth, or back. You suddenly feel sweaty, dizzy, or light-headed. You have chest pain or shortness of breath. You vomit and the vomit is green, yellow, or black, or it looks like blood or coffee grounds. You faint. You have stool that is red, bloody, or black. You cannot swallow, drink, or eat. These symptoms may represent a serious problem that is an emergency. Do not wait to see if the symptoms will go away. Get medical help right away. Call your local emergency services (911 in the [place] Do not drive yourself to the hospital. Summary Gastroesophageal reflux happens when acid from the stomach flows up into the esophagus. GERD is a disease in which the reflux happens often, causes frequent or severe symptoms, or causes problems such as damage to the esophagus. Treatment for this condition may vary depending on how severe your symptoms are. Your health care provider may recommend diet  and lifestyle changes, medicine, or surgery. Contact a health care provider if you have new or worsening symptoms. Take over-the-counter and prescription medicines only as told by your health care provider. Do not take aspirin, ibuprofen, or other NSAIDs unless your health care provider told you to do so. Keep all follow-up visits as told by your health care provider. This is important. This information is not intended to replace advice given to you by your health care provider. Make sure you discuss any questions you have with your health care provider. Document Revised: 05/07/2020 Document Reviewed: 05/07/2020 Elsevier Patient Education  2022 [employer].

## 2021-07-10 NOTE — Progress Notes (Signed)
Established Patient Office Visit  Subjective:  Patient ID: Warren Owens, male    DOB: August 26, 1976  Age: 45 y.o. MRN: [id number]  CC:  Chief Complaint  Patient presents with   Abdominal Pain     HPI Anmol Fleck Reports that he was seen in the emergency department on May 29, 2021.  Hospital note:  Alert 45 year old male no acute distress, nontoxic-appearing.  Presents with a chief complaint of chest pain.  Patient had 2 episodes of chest pain each lasting approximately 10 seconds.  No chest pain at present   ACS work-up was obtained while patient was in triage.  EKG shows sinus rhythm with sinus arrhythmia Chest x-ray shows no active cardiopulmonary disease Troponin 11 and 10 with delta of -1 Heart score of 1; low suspicion of ACS at this time.  We will have patient follow-up with [place] [facility] wellness clinic.   Low suspicion for PE as patient can be ruled out by [facility]   Patient given strict return precautions.  Patient expressed understanding of all instructions and is agreeable with this plan.   Reports today that he continues to have episodes of pressure in his epigastric area, states they occur a couple times a day, denies any acid reflux.  Reports that he has been working on eating less, eating more healthy foods, but does endorse that he continues to eat fast food.  Other than that he has not tried anything for relief.  States that he has been having abnormal bowel movements, describes it as constipated for the last 2 weeks, denies straining, but does endorse smaller amounts of stool.  Reports that he is drinking approximately 3 bottles of water a day.    Past Medical History:  Diagnosis Date   Hyperlipidemia     History reviewed. No pertinent surgical history.  History reviewed. No pertinent family history.  Social History   Socioeconomic History   Marital status: Married    Spouse name: Not on file   Number of children: Not on  file   Years of education: Not on file   Highest education level: Not on file  Occupational History   Not on file  Tobacco Use   Smoking status: Never   Smokeless tobacco: Never  Substance and Sexual Activity   Alcohol use: No   Drug use: No   Sexual activity: Yes  Other Topics Concern   Not on file  Social History Narrative   Not on file   Social Determinants of Health   Financial Resource Strain: Not on file  Food Insecurity: Not on file  Transportation Needs: Not on file  Physical Activity: Not on file  Stress: Not on file  Social Connections: Not on file  Intimate Partner Violence: Not on file    Outpatient Medications Prior to Visit  Medication Sig Dispense Refill   albuterol (PROVENTIL HFA;VENTOLIN HFA) 108 (90 BASE) MCG/ACT inhaler Inhale into the lungs every 6 (six) hours as needed for wheezing or shortness of breath. (Patient not taking: Reported on 07/10/2021)     EPINEPHrine (EPIPEN 2-PAK) 0.3 mg/0.3 mL IJ SOAJ injection Inject 0.3 mLs (0.3 mg total) into the muscle once. (Patient not taking: No sig reported) 1 Device 0   No facility-administered medications prior to visit.    Allergies  Allergen Reactions   Shellfish Allergy Anaphylaxis    ROS Review of Systems  Constitutional: Negative.   HENT: Negative.    Eyes: Negative.   Respiratory:  Negative for shortness of breath.  Cardiovascular:  Negative for chest pain.  Gastrointestinal:  Positive for abdominal pain and constipation. Negative for diarrhea, nausea and vomiting.  Endocrine: Negative.   Genitourinary: Negative.   Musculoskeletal: Negative.   Skin: Negative.   Allergic/Immunologic: Negative.   Neurological: Negative.   Hematological: Negative.   Psychiatric/Behavioral: Negative.       Objective:    Physical Exam Vitals and nursing note reviewed.  Constitutional:      Appearance: Normal appearance.  HENT:     Head: Normocephalic and atraumatic.     Right Ear: External ear normal.      Left Ear: External ear normal.     Nose: Nose normal.     Mouth/Throat:     Mouth: Mucous membranes are moist.     Pharynx: Oropharynx is clear.  Eyes:     Extraocular Movements: Extraocular movements intact.     Conjunctiva/sclera: Conjunctivae normal.     Pupils: Pupils are equal, round, and reactive to light.  Cardiovascular:     Rate and Rhythm: Normal rate and regular rhythm.     Pulses: Normal pulses.     Heart sounds: Normal heart sounds.  Pulmonary:     Effort: Pulmonary effort is normal.     Breath sounds: Normal breath sounds.  Abdominal:     General: Bowel sounds are normal. There is distension.     Tenderness: There is abdominal tenderness in the epigastric area. Negative signs include Murphy's sign.  Musculoskeletal:        General: Normal range of motion.     Cervical back: Normal range of motion and neck supple.  Skin:    General: Skin is warm and dry.  Neurological:     General: No focal deficit present.     Mental Status: He is alert and oriented to person, place, and time.  Psychiatric:        Mood and Affect: Mood normal.        Behavior: Behavior normal.        Thought Content: Thought content normal.        Judgment: Judgment normal.    BP 120/88 (BP Location: Left Arm, Patient Position: Sitting, Cuff Size: Normal)   Pulse 89   Temp 98.6 F (37 C) (Oral)   Resp 18   Ht 5' 3.5" (1.613 m)   Wt 190 lb (86.2 kg)   SpO2 99%   BMI 33.13 kg/m  Wt Readings from Last 3 Encounters:  07/10/21 190 lb (86.2 kg)  01/04/19 180 lb 9.6 oz (81.9 kg)  04/19/15 167 lb (75.8 kg)     Health Maintenance Due  Topic Date Due   COVID-19 Vaccine (1) Never done   Hepatitis C Screening  Never done   TETANUS/TDAP  11/11/2020   COLONOSCOPY (Pts 45-38yrs Insurance coverage will need to be confirmed)  Never done   INFLUENZA VACCINE  06/10/2021    There are no preventive care reminders to display for this patient.  Lab Results  Component Value Date   TSH 1.750  07/11/2021   Lab Results  Component Value Date   WBC 9.4 05/29/2021   HGB 15.7 05/29/2021   HCT 45.6 05/29/2021   MCV 89.1 05/29/2021   PLT 293 05/29/2021   Lab Results  Component Value Date   NA 136 05/29/2021   K 3.8 05/29/2021   CO2 25 05/29/2021   GLUCOSE 100 (H) 05/29/2021   BUN 11 05/29/2021   CREATININE 0.95 05/29/2021   BILITOT 0.5 07/18/2014  ALKPHOS 91 07/18/2014   AST 25 07/18/2014   ALT 33 07/18/2014   PROT 7.1 07/18/2014   ALBUMIN 4.2 07/18/2014   CALCIUM 9.1 05/29/2021   ANIONGAP 8 05/29/2021   Lab Results  Component Value Date   CHOL 203 (H) 07/11/2021   Lab Results  Component Value Date   HDL 45 07/11/2021   Lab Results  Component Value Date   LDLCALC 130 (H) 07/11/2021   Lab Results  Component Value Date   TRIG 157 (H) 07/11/2021   Lab Results  Component Value Date   CHOLHDL 4.5 07/11/2021   Lab Results  Component Value Date   HGBA1C 5.3 01/04/2019      Assessment & Plan:   Problem List Items Addressed This Visit       Digestive   Gastroesophageal reflux disease without esophagitis - Primary   Relevant Orders   Comp. Metabolic Panel (12)   TSH (Completed)     Other   Dyslipidemia   Relevant Orders   Lipid panel (Completed)   Other constipation   Other Visit Diagnoses     Screening for HIV (human immunodeficiency virus)       Relevant Orders   HIV Antibody (routine testing w rflx) (Completed)   Encounter for HCV screening test for low risk patient       Relevant Orders   HCV Ab w Reflex to Quant PCR     1. Gastroesophageal reflux disease without esophagitis Trial omeprazole 20 mg, patient wants to purchase this over-the-counter.  Patient education given lifestyle modifications.  Patient to return to mobile unit for fasting labs.  Patient is in process of applying for [employer] financial assistance, patient given follow-up appointment with primary care provider.  Red flags given for prompt reevaluation. - Comp.  Metabolic Panel (12); Future - TSH; Future  2. Other constipation  Patient encouraged to increase hydration, fiber intake, MiraLAX over-the-counter.  Red flags given for prompt reevaluation.  3. Dyslipidemia Patient to return to mobile unit for fasting labs. - Lipid panel; Future  4. Screening for HIV (human immunodeficiency virus)  - HIV Antibody (routine testing w rflx); Future  5. Encounter for HCV screening test for low risk patient  - HCV Ab w Reflex to Quant PCR; Future   I have reviewed the patient's medical history (PMH, PSH, Social History, Family History, Medications, and allergies) , and have been updated if relevant. I spent 30 minutes reviewing chart and  face to face time with patient.     No orders of the defined types were placed in this encounter.   Follow-up: Return in about 2 months (around 09/09/2021) for with Gwinda Passe .    Kasandra Knudsen Mayers, PA-C

## 2021-07-11 ENCOUNTER — Ambulatory Visit: Payer: Self-pay | Admitting: *Deleted

## 2021-07-11 DIAGNOSIS — Z1159 Encounter for screening for other viral diseases: Secondary | ICD-10-CM

## 2021-07-11 DIAGNOSIS — E785 Hyperlipidemia, unspecified: Secondary | ICD-10-CM

## 2021-07-11 DIAGNOSIS — Z114 Encounter for screening for human immunodeficiency virus [HIV]: Secondary | ICD-10-CM

## 2021-07-11 DIAGNOSIS — K219 Gastro-esophageal reflux disease without esophagitis: Secondary | ICD-10-CM

## 2021-07-11 NOTE — Patient Instructions (Signed)
Patient aware of receiving results on Monday.

## 2021-07-11 NOTE — Progress Notes (Signed)
Patient verified DOB Patient tolerated blood draw well today with 1 successful stick in the left antecubital space.

## 2021-07-12 DIAGNOSIS — K5909 Other constipation: Secondary | ICD-10-CM | POA: Insufficient documentation

## 2021-07-12 DIAGNOSIS — K219 Gastro-esophageal reflux disease without esophagitis: Secondary | ICD-10-CM | POA: Insufficient documentation

## 2021-07-12 LAB — LIPID PANEL
Chol/HDL Ratio: 4.5 ratio (ref 0.0–5.0)
Cholesterol, Total: 203 mg/dL — ABNORMAL HIGH (ref 100–199)
HDL: 45 mg/dL (ref >39)
LDL Chol Calc (NIH): 130 mg/dL — ABNORMAL HIGH (ref 0–99)
Triglycerides: 157 mg/dL — ABNORMAL HIGH (ref 0–149)
VLDL Cholesterol Cal: 28 mg/dL (ref 5–40)

## 2021-07-12 LAB — HIV ANTIBODY (ROUTINE TESTING W REFLEX): HIV Screen 4th Generation wRfx: NONREACTIVE

## 2021-07-12 LAB — SPECIMEN STATUS REPORT

## 2021-07-12 LAB — TSH: TSH: 1.75 u[IU]/mL (ref 0.450–4.500)

## 2021-07-14 LAB — COMP. METABOLIC PANEL (12)
AST: 33 IU/L (ref 0–40)
Albumin/Globulin Ratio: 1.4 (ref 1.2–2.2)
Albumin: 4.2 g/dL (ref 4.0–5.0)
Alkaline Phosphatase: 99 IU/L (ref 44–121)
BUN/Creatinine Ratio: 14 (ref 9–20)
BUN: 14 mg/dL (ref 6–24)
Bilirubin Total: 0.7 mg/dL (ref 0.0–1.2)
Calcium: 9.3 mg/dL (ref 8.7–10.2)
Chloride: 103 mmol/L (ref 96–106)
Creatinine, Ser: 1.03 mg/dL (ref 0.76–1.27)
Globulin, Total: 2.9 g/dL (ref 1.5–4.5)
Glucose: 100 mg/dL — ABNORMAL HIGH (ref 65–99)
Potassium: 4.3 mmol/L (ref 3.5–5.2)
Sodium: 138 mmol/L (ref 134–144)
Total Protein: 7.1 g/dL (ref 6.0–8.5)
eGFR: 91 mL/min/{1.73_m2} (ref >59)

## 2021-07-14 LAB — HCV AB W REFLEX TO QUANT PCR: HCV Ab: <0.1 s/co ratio (ref 0.0–0.9)

## 2021-07-14 LAB — HCV INTERPRETATION

## 2021-07-18 ENCOUNTER — Other Ambulatory Visit: Payer: Self-pay

## 2021-07-18 ENCOUNTER — Ambulatory Visit: Payer: Self-pay | Attending: Primary Care

## 2021-07-23 ENCOUNTER — Telehealth: Payer: Self-pay | Admitting: *Deleted

## 2021-07-23 NOTE — Telephone Encounter (Signed)
Medical Assistant used [employer] to contact patient.  Interpreter Name: Alphonsa Overall #: [id number] Patient is aware of labs and screenings being normal except for elevated cholesterol and needing to adhere to low cholesterol diet and have a recheck in 6 months to 1 year.

## 2021-07-23 NOTE — Telephone Encounter (Signed)
-----   Message from Roney Jaffe, [place] sent at 07/16/2021  2:37 PM EDT ----- Please call patient and let him know that his thyroid function, kidney and liver function are within normal limits.  His screening for hepatitis C and HIV were negative. His cholesterol overall is elevated, his triglycerides and LDL are elevated.  His risk for cardiovascular event in the next 10 years is 2.1%.  He does not need to start medication at this time, but is very important that he follow a low-cholesterol diet and have his cholesterol rechecked checked in 6 months to 1 year.  The 10-year ASCVD risk score Denman George DC Montez Hageman., et al., 2013) is: 2.1%   Values used to calculate the score:     Age: 45 years     Sex: Male     Is Non-Hispanic African American: No     Diabetic: No     Tobacco smoker: No     Systolic Blood Pressure: 120 mmHg     Is BP treated: No     HDL Cholesterol: 45 mg/dL     Total Cholesterol: 203 mg/dL

## 2021-09-09 ENCOUNTER — Other Ambulatory Visit: Payer: Self-pay

## 2021-09-09 ENCOUNTER — Encounter (INDEPENDENT_AMBULATORY_CARE_PROVIDER_SITE_OTHER): Payer: Self-pay | Admitting: Primary Care

## 2021-09-09 ENCOUNTER — Ambulatory Visit (INDEPENDENT_AMBULATORY_CARE_PROVIDER_SITE_OTHER): Payer: Self-pay | Admitting: Primary Care

## 2021-09-09 VITALS — BP 117/81 | HR 109 | Temp 97.5°F | Ht 62.0 in | Wt 183.8 lb

## 2021-09-09 DIAGNOSIS — E785 Hyperlipidemia, unspecified: Secondary | ICD-10-CM

## 2021-09-09 DIAGNOSIS — Z6833 Body mass index (BMI) 33.0-33.9, adult: Secondary | ICD-10-CM

## 2021-09-09 DIAGNOSIS — K2100 Gastro-esophageal reflux disease with esophagitis, without bleeding: Secondary | ICD-10-CM

## 2021-09-09 DIAGNOSIS — Z23 Encounter for immunization: Secondary | ICD-10-CM

## 2021-09-09 DIAGNOSIS — R079 Chest pain, unspecified: Secondary | ICD-10-CM

## 2021-09-09 MED ORDER — OMEPRAZOLE 20 MG PO CPDR
20.0000 mg | DELAYED_RELEASE_CAPSULE | Freq: Every day | ORAL | 3 refills | Status: DC
  Filled 2021-09-09: qty 30, 30d supply, fill #0

## 2021-09-09 NOTE — Progress Notes (Signed)
  [facility]    Subjective:     [name] is an 45 y.o. male who presents for heartburn. This has been associated with chest pain, cough, and heartburn. He denies belching, choking on food, deep pressure at base of neck, difficulty swallowing, and dysphagia. Symptoms have been present for 2 months. He denies dysphagia. He has not lost weight. He denies melena, hematochezia, hematemesis, and coffee ground emesis. Medical therapy in the past has included: proton pump inhibitors.  The following portions of the patient's history were reviewed and updated as appropriate: allergies, current medications, past family history, past medical history, past social history, and past surgical history.  Review of Systems Pertinent items noted in HPI and remainder of comprehensive ROS otherwise negative.   Objective:     BP 117/81 (BP Location: Right Arm, Patient Position: Sitting, Cuff Size: Normal)   Pulse (!) 109   Temp (!) 97.5 F (36.4 C) (Temporal)   Ht 5\' 2"  (1.575 m)   Wt 183 lb 12.8 oz (83.4 kg)   SpO2 95%   BMI 33.62 kg/m  General appearance: alert, cooperative, and mildly obese Head: Normocephalic, without obvious abnormality, atraumatic Eyes: conjunctivae/corneas clear. PERRL, EOM's intact. Fundi benign. Ears: normal TM's and external ear canals both ears Nose: Nares normal. Septum midline. Mucosa normal. No drainage or sinus tenderness. Neck: no adenopathy, no carotid bruit, no JVD, supple, symmetrical, trachea midline, and thyroid not enlarged, symmetric, no tenderness/mass/nodules Lungs: clear to auscultation bilaterally Heart: regular rate and rhythm, S1, S2 normal, no murmur, click, rub or gallop Abdomen: soft, non-tender; bowel sounds normal; no masses,  no organomegaly Extremities: extremities normal, atraumatic, no cyanosis or edema Skin: Skin color, texture, turgor normal. No rashes or lesions Lymph nodes: Cervical, supraclavicular, and  axillary nodes normal.  Zylon was seen today for follow-up.  Diagnoses and all orders for this visit:  Need for Tdap vaccination -     Tdap vaccine greater than or equal to 7yo IM  Dyslipidemia  Healthy lifestyle diet of fruits vegetables fish nuts whole grains and low saturated fat . Foods high in cholesterol or liver, fatty meats,cheese, butter avocados, nuts and seeds, chocolate and fried foods. No medication life style modification   Class 2 severe obesity due to excess calories with serious comorbidity in adult, unspecified BMI (HCC) Obesity is 30-39 indicating an excess in caloric intake or underlining conditions. This may lead to other co-morbidities. Lifestyle modifications of diet and exercise may reduce obesity.    Gastroesophageal reflux disease with esophagitis without hemorrhage Discussed eating small frequent meal, reduction in acidic foods, fried foods ,spicy foods, alcohol caffeine and tobacco and certain medications. Avoid laying down after eating 83mins-1hour, elevated head of the bed.  Drinks 3 cups of coffee a day. -     omeprazole (PRILOSEC) 20 MG capsule; Take 1 capsule (20 mg total) by mouth daily.  Chest pain, unspecified type  Discussed s/s for MI- patient verbalized understanding . ASA 325mg  911. Chest pain is all over upper chest aggravated by spicy foods and underlining cause is caffeine  This note has been created with 31m. Any transcriptional errors are unintentional.  , Np

## 2021-09-09 NOTE — Patient Instructions (Addendum)
Vacuna Tdap (ttanos, difteria y tos ferina): lo que debe saber Tdap (Tetanus, Diphtheria, Pertussis) Vaccine: What You Need to Know 1. Por qu vacunarse? La vacuna Tdap puede prevenir el ttanos, la difteria y la tos ferina. La difteria y la tos ferina se contagian de persona a persona. El ttanos ingresa al organismo a travs de cortes o heridas. El TTANOS (T) provoca rigidez dolorosa en los msculos. El ttanos puede causar graves problemas de salud, como no poder abrir la boca, tener dificultad para tragar y respirar, o la muerte. La DIFTERIA (D) puede causar dificultad para respirar, insuficiencia cardaca, parlisis o muerte. La TOS FERINA (aP), tambin conocida como "tos convulsa", puede causar tos violenta e incontrolable lo que hace difcil respirar, comer o beber. La tos ferina puede ser muy grave, especialmente en los bebs y en los nios pequeos, y causar neumona, convulsiones, dao cerebral o la muerte. En adolescentes y adultos, puede causar prdida de peso, prdida del control de la vejiga, desmayos y fracturas de costillas al toser de manera intensa. 2. Vacuna Tdap La vacuna Tdap es solo para nios de 7 aos en adelante, adolescentes y adultos.  Los adolescentes deben recibir una dosis nica de la vacuna Tdap, preferentemente a los 11 o 12 aos. Las personas embarazadas deben recibir una dosis de la vacuna Tdap durante cada embarazo, preferiblemente durante la primera parte del tercer trimestre, para ayudar a proteger al recin nacido de la tos ferina. Los bebs tienen mayor riesgo de sufrir complicaciones graves y potencialmente mortales debido a la tos ferina. Los adultos que nunca recibieron la vacuna Tdap deben recibir una dosis. Adems, los adultos deben recibir una dosis de refuerzo de Tdap o Td (una vacuna diferente que protege contra el ttanos y la difteria, pero no contra la tos ferina) cada 10 aos, o despus de 5 aos en caso de herida o quemadura grave o sucia. La  vacuna Tdap puede ser administrada al mismo tiempo que otras vacunas. 3. Hable con el mdico Comunquese con la persona que le coloca las vacunas si la persona que la recibe: Ha tenido una reaccin alrgica despus de una dosis anterior de cualquier vacuna contra el ttanos, la difteria o la tos ferina, o cualquier alergia grave, potencialmente mortal Ha tenido un coma, disminucin del nivel de la conciencia o convulsiones prolongadas dentro de los 7 das posteriores a una dosis anterior de cualquier vacuna contra la tos ferina (DTP, DTaP o Tdap) Tiene convulsiones u otro problema del sistema nervioso Alguna vez tuvo sndrome de Guillain-Barr (tambin llamado "SGB") Ha tenido dolor intenso o hinchazn despus de una dosis anterior de cualquier vacuna contra el ttanos o la difteria En algunos casos, es posible que el mdico decida posponer la aplicacin de la vacuna Tdap para una visita en el futuro. Las personas que sufren trastornos menores, como un resfro, pueden vacunarse. Las personas que tienen enfermedades moderadas o graves generalmente deben esperar hasta recuperarse para poder recibir la vacuna Tdap.  Su mdico puede darle ms informacin. 4. Riesgos de una reaccin a la vacuna Despus de recibir la vacuna Tdap a veces se puede tener dolor, enrojecimiento o hinchazn en el lugar donde se aplic la inyeccin, fiebre leve, dolor de cabeza, sensacin de cansancio y nuseas, vmitos, diarrea o dolor de estmago. Las personas a veces se desmayan despus de procedimientos mdicos, incluida la vacunacin. Informe al mdico si se siente mareado, tiene cambios en la visin o zumbidos en los odos.  Al igual que con cualquier medicamento,   existe una probabilidad muy remota de que una vacuna cause una reaccin alrgica grave, otra lesin grave o la muerte. 5. Qu pasa si se presenta un problema grave? Podra producirse una reaccin alrgica despus de que la persona vacunada abandone la clnica. Si  observa signos de Runner, broadcasting/film/video grave (ronchas, hinchazn de la cara y la garganta, dificultad para respirar, latidos cardacos acelerados, mareos o debilidad), llame al 9-1-1 y lleve a la persona al hospital ms cercano. Si se presentan otros signos que le preocupan, comunquese con su mdico.  Las reacciones adversas deben informarse al Sistema de Informe de Eventos Adversos de Administrator, arts (Vaccine Adverse Event Reporting System, VAERS). Por lo general, el mdico presenta este informe o puede hacerlo usted mismo. Visite el sitio web del VAERS en www.vaers.LAgents.no o llame al [phone]. El VAERS es solo para Biomedical engineer, y los miembros de su personal no proporcionan asesoramiento mdico. 6. Programa Nacional de Compensacin de Daos por American Electric Power El SunTrust de Compensacin de Daos por Administrator, arts (National Vaccine Injury Kohl[employer], Cabin crew) es un programa federal que fue creado para Patent examiner a las personas que puedan haber sufrido daos al recibir ciertas vacunas. Las Investment banker, operational a presuntas lesiones o muerte debidas a la vacunacin tienen un lmite de tiempo para su presentacin, que puede ser de tan solo [employer]. Visite el sitio web del VICP en SpiritualWord.at o llame al [phone] para obtener ms informacin acerca del programa y de cmo presentar un reclamo. 7. Cmo puedo obtener ms informacin? Pregntele a su mdico. Comunquese con el servicio de salud de su localidad o su estado. Visite el sitio web de Chief Technology Officer, Therapist, music) (Administracin de Alimentos y Media planner) para ver los prospectos de las vacunas e informacin adicional en [email or web address]. Comunquese con Building control surveyor for [employer], CDC (Centros para el Control y la Prevencin de [place]): Llame al [phone] (1-800-CDC-INFO) o Visite el sitio web de ALLTEL Corporation en [email or web address]. Declaracin  de informacin de la vacuna Tdap (ttanos, difteria y Kalman Shan) (06/15/2020) Esta informacin no tiene Theme park manager el consejo del mdico. Asegrese de hacerle al mdico cualquier pregunta que tenga. Document Revised: 08/14/2020 Document Reviewed: 07/30/2020 Elsevier Patient Education  2022 Elsevier Inc. Gripe en los adultos Influenza, Adult A la gripe tambin se la conoce como "influenza". Es una Advance Auto , la nariz y la garganta (vas respiratorias). Se transmite fcilmente de persona a persona (es contagiosa). La gripe causa sntomas que son [employer] Corporation de un resfro, junto con fiebre alta y dolores corporales. Cules son las causas? La causa de esta afeccin es el virus de la influenza. Puede contraer el virus de las siguientes maneras: Al inhalar gotitas que quedan en el aire despus de que una persona infectada con gripe tosi o estornud. Al tocar algo que est contaminado con el virus y [employer] mano a la boca, la nariz o los ojos. Qu incrementa el riesgo? Hay ciertas cosas que lo pueden hacer ms propenso a Warden/ranger. Estas incluyen lo siguiente: No lavarse las manos con frecuencia. Tener contacto cercano con Yahoo durante la temporada de resfro y gripe. Tocarse la boca, los ojos o la nariz sin antes lavarse las manos. No recibir la Teachers Insurance and Annuity Association. Puede correr un mayor riesgo de [place] gripe, y [place] graves, como una infeccin pulmonar (neumona), si usted: Es mayor de 65 aos de edad. Est embarazada. Tiene debilitado el sistema que combate las defensas (sistema inmunitario)  debido a una enfermedad o a que toma determinados medicamentos. Tiene una afeccin a largo plazo (crnica), como las siguientes: Enfermedad cardaca, renal o pulmonar. Diabetes. Asma. Tiene un trastorno heptico. Tiene mucho sobrepeso (obesidad [place]). Tiene anemia. Cules son los signos o sntomas? Los sntomas normalmente comienzan  de repente y Armando Reichert [address] y [address] Pueden incluir los siguientes: Grant Ruts y escalofros. Dolores de [place], dolores en el cuerpo o dolores musculares. Dolor de Advertising copywriter. Tos. Secrecin o congestin nasal. Molestias en el pecho. No querer comer tanto como lo hace normalmente. Sensacin de debilidad o cansancio. Mareos. Malestar estomacal o vmitos. Cmo se trata? Si la gripe se detecta de forma temprana, puede recibir tratamiento con medicamentos antivirales. Esto puede ayudar a reducir la gravedad y la duracin de la enfermedad. Se los administrarn por boca o a travs de un tubo (catter) intravenoso. Cuidarse en su hogar puede ayudar a que mejoren los sntomas. El mdico puede recomendarle lo siguiente: Tomar medicamentos de Sales promotion account executive. Beber abundante lquido. La gripe suele desaparecer sola. Si tiene sntomas muy graves u otros problemas, puede recibir tratamiento en un hospital. Siga estas instrucciones en su casa:   Actividad Descanse todo lo que sea necesario. Duerma lo suficiente. Lanny Hurst en su casa y no concurra al Aleen Campi o a la escuela, como se lo haya indicado el mdico. No salga de su casa hasta que no haya tenido fiebre por 24 horas sin tomar medicamentos. Salga de su casa solamente para ir al American Express. Comida y bebida Beverely Risen SRO (solucin de rehidratacin oral). Es Neomia Dear bebida que se vende en farmacias y tiendas. Beba suficiente lquido como para Pharmacologist la orina de color amarillo plido. En la medida en que pueda, beba lquidos transparentes en pequeas cantidades. Los lquidos transparentes son, por ejemplo: Westley Hummer. Trocitos de hielo. Jugo de frutas mezclado con agua. Bebidas deportivas de bajas caloras. Coma alimentos suaves que sean fciles de digerir. En la medida que pueda, consuma pequeas cantidades. Estos alimentos incluyen: Bananas. Pur de Prax[employer]. Arroz. Carnes magras. Tostadas. Galletas. No coma ni beba lo siguiente: Lquidos con alto contenido  de azcar o cafena. Alcohol. Alimentos condimentados o con alto contenido de [place] Indicaciones generales Use los medicamentos de venta libre y los recetados solamente como se lo haya indicado el mdico. Use un humidificador de aire fro para que el aire de su casa est ms hmedo. Esto puede facilitar la respiracin. Cuando utilice un humidificador de vapor fro, lmpielo a diario. Vace el agua y [place] por agua limpia. Al toser o estornudar, cbrase la boca y la [place]. Lvese las manos frecuentemente con agua y [place] y durante al menos 20 segundos. Esto tambin es importante despus de toser o Engineering geologist. Si no dispone de [place] y [place], use desinfectante para manos con alcohol. Cumpla con todas las visitas de seguimiento. Cmo se previene?  Colquese la vacuna antigripal todos los [place]. Puede colocarse la vacuna contra la gripe a fines de verano, en otoo o en invierno. Pregntele al mdico cundo debe aplicarse la vacuna contra la gripe. Evite el contacto con personas que estn enfermas durante el otoo y el invierno. Es la temporada del resfro y Emergency planning/management officer. Comunquese con un mdico si: Tiene sntomas nuevos. Tiene los siguientes sntomas: Dolor de [place]. Materia fecal lquida (diarrea). Grant Ruts. La tos empeora. Empieza a tener ms mucosidad. Tiene Programme researcher, broadcasting/film/video. Vomita. Solicite ayuda de inmediato si: Le falta el aire. Tiene dificultad para respirar. La piel o las uas se ponen de un color azulado.  Presenta dolor muy intenso o rigidez en el cuello. Tiene dolor de cabeza repentino. Le duele la cara o el odo de forma repentina. No puede comer ni beber sin vomitar. Estos sntomas pueden representar un problema grave que constituye Radio broadcast assistant. Solicite atencin mdica de inmediato. Comunquese con el servicio de emergencias de su localidad (911 en los [place]). No espere a ver si los sntomas desaparecen. No conduzca por sus propios medios Dillard's. Resumen A la gripe tambin se la conoce como "influenza". Es una Advance Auto , la nariz y Administrator. Se transmite fcilmente de [place] persona a otra. Use los medicamentos de venta libre y los recetados solamente como se lo haya indicado el mdico. Aplicarse la vacuna contra la gripe todos los aos es la mejor manera de no contagiarse la gripe. Esta informacin no tiene Theme park manager el consejo del mdico. Asegrese de hacerle al mdico cualquier pregunta que tenga. Document Revised: 08/23/2020 Document Reviewed: 08/23/2020 Elsevier Patient Education  2022 Elsevier Inc. Opciones de alimentos para pacientes adultos con enfermedad de reflujo gastroesofgico Food Choices for Gastroesophageal Reflux Disease, Adult Si tiene enfermedad de reflujo gastroesofgico (ERGE), los alimentos que consume y los hbitos de alimentacin son Engineer, production. Elegir los alimentos adecuados puede ayudar a [employer]. Piense en consultar a un experto en alimentacin (nutricionista) para que lo ayude a Associate Professor. Consejos para seguir Consulting civil engineer las etiquetas de los alimentos Elija alimentos que tengan bajo contenido de grasas saturadas. Los alimentos que pueden ayudar con los sntomas incluyen los siguientes: Alimentos que tienen menos del 5 % de los valores diarios (VD) de grasa. Alimentos que tienen 0 gramos de grasas trans. Al cocinar No frer los alimentos. Cocinar la comida al horno, al vapor, a la plancha o en la parrilla. Estos son mtodos que no necesitan mucha grasa para Water quality scientist. Para agregar sabor, trate de consumir hierbas con bajo contenido de picante y [place]. Planificacin de las comidas  Elegir alimentos saludables con bajo contenido de [place], por ejemplo: Frutas y vegetales. Cereales integrales. Productos lcteos con bajo contenido de [place]. Vita Barley, pescado y aves. Haga comidas pequeas frecuentes en lugar de 3 comidas abundantes al  da. Coma lentamente, en un lugar donde est relajado. Evite agacharse o recostarse hasta 2 o 3 horas despus de haber comido. Limite los alimentos con alto contenido graso como las carnes grasas o los alimentos fritos. Limite su ingesta de alimentos grasos, como aceites, [place] y [place]. Evite lo siguiente si el mdico se lo indica: Consumir alimentos que le ocasionen sntomas. Pueden ser distintos para cada persona. Lleve un registro de los alimentos para identificar aquellos que le causen sntomas. Alcohol. Beber mucha cantidad de lquido con las comidas. Comer 2 o 3 horas antes de acostarse. Estilo de vida Mantenga un peso saludable. Pregntele a su mdico cul es el peso saludable para usted. Si necesita perder peso, hable con su mdico para hacerlo de manera segura. Haga actividad fsica durante, al menos, 30 minutos 5 das por semana o ms, o como se lo haya indicado el mdico. Use ropa suelta. No fume ni consuma ningn producto que contenga nicotina o tabaco. Si necesita ayuda para dejar de fumar, consulte al mdico. Duerma con la cabecera de la cama ms elevada que los pies. Use una cua debajo del colchn o bloques debajo del armazn de la cama para Pharmacologist la cabecera de la cama elevada. Mastique chicle sin azcar despus de las comidas. Ladell Heads  alimentos debo comer? Siga una dieta saludable y bien equilibrada que incluya abundantes frutas, verduras, cereales integrales, productos lcteos descremados, carnes magras, pescado y aves. Cada persona es diferente. Los alimentos que pueden causar sntomas en una persona pueden no causar sntomas en otra persona. Trabaje con el mdico para hallar alimentos que sean seguros para usted. Es posible que los productos que se enumeran ms [place] no constituyan una lista completa de lo que puede comer y Product manager. Pngase en contacto con un experto en alimentos para conocer ms opciones. Qu alimentos debo evitar? Limitar algunos de estos alimentos  puede ayudar a Chief Operating Officer los sntomas de [place]. Cada persona es diferente. Consulte a un experto en alimentacin o a su mdico para que lo ayude a Clinical research associate los alimentos exactos que debe evitar, si los hubiere. Frutas Cualquier fruta que est preparada con grasa agregada. Cualquier fruta que le ocasione sntomas. Para algunas personas, estas pueden incluir, las frutas ctricas como naranja, pomelo, pia y limn. Verduras Verduras fritas en abundante aceite. Papas fritas. Cualquier verdura que est preparada con grasa agregada. Cualquier verdura que le ocasione sntomas. Para algunas personas, estas pueden incluir tomates y productos con tomate, [place], cebollas y [place], y rbanos picantes. Granos Pasteles o panes sin levadura con grasa agregada. Carnes y [address] protenas [address] de alto contenido graso como carne grasa de vaca o cerdo, salchichas, costillas, jamn, salchicha, salame y tocino. Carnes o protenas fritas, lo que incluye pescado frito y pollo frito. Frutos secos y Civil engineer, contracting de frutos secos, en grandes cantidades. Lcteos Leche entera y [place] con chocolate. Tami Ribas. Crema. Helado. Queso crema. Batidos con [employer]. Grasas y Barnes [employer] Noble. Margarina. Lardo. Mantequilla clarificada. Bebidas Caf y t negro, con o sin cafena. Bebidas con gas. Refrescos. Bebidas energizantes. Jugo de fruta hecho con frutas cidas, como naranja o pomelo. Jugo de tomate. Bebidas alcohlicas. Dulces y postres Chocolate y cacao. [place]. Alios y condimentos Pimienta. Menta y mentol. Sal agregada. Cualquier condimento, hierbas o aderezos que le ocasionen sntomas. Para algunas personas, esto puede incluir curry, salsa picante o aderezos para ensalada a base de vinagre. Es posible que los productos que se enumeran ms arriba no constituyan una lista completa de lo que no debera comer y Product manager. Pngase en contacto con un experto en alimentos para conocer ms opciones. Preguntas para hacerle al  American Express Los cambios en la dieta y en el estilo de vida a menudo son los primeros pasos que se toman para Company secretary los sntomas de [place]. Si los [employer] dieta y el estilo de vida no ayudan, hable con el mdico sobre el uso de medicamentos. Dnde buscar ms informacin Arts development officer for Gastrointestinal Disorders (Fundacin Internacional para los Trastornos Gastrointestinales): aboutgerd.org Resumen Si tiene [employer], las elecciones de alimentos y el [place] de vida son muy importantes para ayudar a Consulting civil engineer sntomas. Haga comidas pequeas durante Glass blower/designer de 3 comidas abundantes. Coma lentamente y en un lugar donde est relajado. Evite agacharse o recostarse hasta 2 o 3 horas despus de haber comido. Limite los alimentos con alto contenido graso como las carnes grasas o los alimentos fritos. Esta informacin no tiene Theme park manager el consejo del mdico. Asegrese de hacerle al mdico cualquier pregunta que tenga. Document Revised: 06/08/2020 Document Reviewed: 06/08/2020 Elsevier Patient Education  2022 [employer].

## 2021-09-16 ENCOUNTER — Other Ambulatory Visit: Payer: Self-pay

## 2021-11-06 ENCOUNTER — Encounter (INDEPENDENT_AMBULATORY_CARE_PROVIDER_SITE_OTHER): Payer: Self-pay | Admitting: Primary Care

## 2021-11-06 ENCOUNTER — Ambulatory Visit (INDEPENDENT_AMBULATORY_CARE_PROVIDER_SITE_OTHER): Payer: Self-pay | Admitting: Primary Care

## 2021-11-06 ENCOUNTER — Other Ambulatory Visit: Payer: Self-pay

## 2021-11-06 VITALS — BP 131/91 | HR 88 | Temp 97.3°F | Ht 63.0 in | Wt 189.4 lb

## 2021-11-06 DIAGNOSIS — R7303 Prediabetes: Secondary | ICD-10-CM

## 2021-11-06 DIAGNOSIS — M25531 Pain in right wrist: Secondary | ICD-10-CM

## 2021-11-06 DIAGNOSIS — Z131 Encounter for screening for diabetes mellitus: Secondary | ICD-10-CM

## 2021-11-06 DIAGNOSIS — I10 Essential (primary) hypertension: Secondary | ICD-10-CM

## 2021-11-06 DIAGNOSIS — M25532 Pain in left wrist: Secondary | ICD-10-CM

## 2021-11-06 LAB — POCT GLYCOSYLATED HEMOGLOBIN (HGB A1C): Hemoglobin A1C: 5.7 % — AB (ref 4.0–5.6)

## 2021-11-06 NOTE — Patient Instructions (Signed)
Prediabetes  Your A1C is a measure of your sugar over the past 3 months and is not affected by what you have eaten over the past few days. Diabetes increases your chances of stroke and heart attack over 300 % and is the leading cause of blindness and kidney failure in the [place]. Please make sure you decrease bad carbs like white bread, white rice, potatoes, corn, soft drinks, pasta, cereals, refined sugars, sweet tea, dried fruits, and fruit juice. Good carbs are okay to eat in moderation like sweet potatoes, brown rice, whole grain pasta/bread, most fruit (except dried fruit) and you can eat as many veggies as you want.   Greater than 6.5 is considered diabetic. Between 6.4 and 5.7 is prediabetic If your A1C is less than 5.7 you are NOT diabetic.  Targets for Glucose Readings: Time of Check Target for patients WITHOUT Diabetes Target for DIABETICS  Before Meals Less than 100  less than 150  Two hours after meals Less than 200  Less than 250  Preventing Hypertension Hypertension, also called high blood pressure, is when the force of blood pumping through the arteries is too strong. Arteries are blood vessels that carry blood from the heart throughout the body. Often, hypertension does not cause symptoms until blood pressure is very high. It is important to have your blood pressure checked regularly. Diet and lifestyle changes can help you prevent hypertension, and they may make you feel better overall and improve your quality of life. If you already have hypertension, you may control it with diet and lifestyle changes, as well as with medicine. How can this condition affect me? Over time, hypertension can damage the arteries and decrease blood flow to important parts of the body, including the brain, heart, and kidneys. By keeping your blood pressure in a healthy range, you can help prevent complications like heart attack, heart failure, stroke, kidney failure, and vascular dementia. What can  increase my risk? Being an older adult. Older people are more often affected. Having family members who have had high blood pressure. Being obese. Being male. Males are more likely to have high blood pressure. Drinking too much alcohol or caffeine. Smoking or using illegal drugs. Taking certain medicines, such as antidepressants, decongestants, birth control pills, and NSAIDs, such as ibuprofen. Having thyroid problems. Having certain tumors. What actions can I take to prevent or manage this condition? Work with your health care provider to make a hypertension prevention plan that works for you. Follow your plan and keep all follow-up visits as told by your health care provider. Diet changes Maintain a healthy diet. This includes: Eating less salt (sodium). Ask your health care provider how much sodium is safe for you to have. The general recommendation is to have less than 1 tsp (2,300 mg) of sodium a day. Do not add salt to your food. Choose low-sodium options when grocery shopping and eating out. Limiting fats in your diet. You can do this by eating low-fat or fat-free dairy products and by eating less red meat. Eating more fruits, vegetables, and whole grains. Make a goal to eat: 1-2 cups of fresh fruits and vegetables each day. 3-4 servings of whole grains each day. Avoiding foods and beverages that have added sugars. Eating fish that contain healthy fats (omega-3 fatty acids), such as mackerel or salmon. If you need help putting together a healthy eating plan, try the DASH diet. This diet is high in fruits, vegetables, and whole grains. It is low in sodium,  red meat, and added sugars. DASH stands for Dietary Approaches to Stop Hypertension. Lifestyle changes Lose weight if you are overweight. Losing just 3?5% of your body weight can help prevent or control hypertension. For example, if your present weight is 200 lb (91 kg), a loss of 3-5% of your weight means losing 6-10 lb (2.7-4.5  kg). Ask your health care provider to help you with a diet and exercise plan to safely lose weight. Other recommendations usually include: Get enough exercise. Do at least 150 minutes of moderate-intensity exercise each week. You could do this in short exercise sessions several times a day, or you could do longer exercise sessions a few times a week. For example, you could take a brisk 10-minute walk or bike ride, 3 times a day, for 5 days a week. Find ways to reduce stress, such as exercising, meditating, listening to music, or taking a yoga class. If you need help reducing stress, ask your health care provider. Do not use any products that contain nicotine or tobacco, such as cigarettes, e-cigarettes, and chewing tobacco. If you need help quitting, ask your health care provider. Chemicals in tobacco and nicotine products raise your blood pressure each time you use them. If you need help quitting, ask your health care provider. Learn how to check your blood pressure at home. Make sure that you know your personal target blood pressure, as told by your health care provider. Try to sleep 7-9 hours per night.  Alcohol use Do not drink alcohol if: Your health care provider tells you not to drink. You are pregnant, may be pregnant, or are planning to become pregnant. If you drink alcohol: Limit how much you use to: 0-1 drink a day for women. 0-2 drinks a day for men. Be aware of how much alcohol is in your drink. In the [place] one drink equals one 12 oz bottle of beer (355 mL), one 5 oz glass of wine (148 mL), or one 1 oz glass of hard liquor (44 mL). Medicines In addition to diet and lifestyle changes, your health care provider may recommend medicines to help lower your blood pressure. In general: You may need to try a few different medicines to find what works best for you. You may need to take more than one medicine. Take over-the-counter and prescription medicines only as told by your health care  provider. Questions to ask your health care provider What is my blood pressure goal? How can I lower my risk for high blood pressure? How should I monitor my blood pressure at home? Where to find support Your health care provider can help you prevent hypertension and help you keep your blood pressure at a healthy level. Your local hospital or your community may also provide support services and prevention programs. The American Heart Association offers an online support network at supportnetwork.heart.org Where to find more information Learn more about hypertension from: National Heart, Lung, and Blood Institute: PopSteam.is Centers for Disease Control and Prevention: FootballExhibition.com.br American Academy of Family Physicians: familydoctor.org Learn more about the DASH diet from: National Heart, Lung, and Blood Institute: PopSteam.is Contact a health care provider if: You think you are having a reaction to medicines you have taken. You have recurrent headaches or feel dizzy. You have swelling in your ankles. You have trouble with your vision. Get help right away if: You have sudden, severe chest, back, or abdominal pain or discomfort. You have shortness of breath. You have a sudden, severe headache. These symptoms may represent  a serious problem that is an emergency. Do not wait to see if the symptoms will go away. Get medical help right away. Call your local emergency services (911 in the [place] Do not drive yourself to the hospital.  Summary Hypertension often does not cause any symptoms until blood pressure is very high. It is important to get your blood pressure checked regularly. Diet and lifestyle changes are important steps in preventing hypertension. By keeping your blood pressure in a healthy range, you may prevent complications like heart attack, heart failure, stroke, and kidney failure. Work with your health care provider to make a hypertension prevention plan that works  for you. This information is not intended to replace advice given to you by your health care provider. Make sure you discuss any questions you have with your health care provider. Document Revised: 09/27/2019 Document Reviewed: 09/27/2019 Elsevier Patient Education  2022 [employer].

## 2021-11-06 NOTE — Progress Notes (Signed)
[facility]   Subjective:    Mr. Cordera Stineman is an 45 y.o. male who presents for evaluation of bilateral wrist pain. Onset was gradual, starting about 3 week ago. The pain is mild, worsens with movement, and is relieved by rest. There is no associated tingling, weakness in fingers. There is no history of injury. Evaluation to date: none. Treatment to date: OTC analgesics, ice, avoidance of offending activity.  The following portions of the patient's history were reviewed and updated as appropriate: allergies, current medications, past family history, past medical history, past social history, and past surgical history.  Review of Systems Pertinent items noted in HPI and remainder of comprehensive ROS otherwise negative.   Objective:    BP (!) 131/91 (BP Location: Right Arm, Patient Position: Sitting, Cuff Size: Large)    Pulse 88    Temp (!) 97.3 F (36.3 C) (Temporal)    Ht 5\' 3"  (1.6 m)    Wt 189 lb 6.4 oz (85.9 kg)    SpO2 98%    BMI 33.55 kg/m  Right wrist:  normal exam, no swelling, tenderness, instability; ligaments intact, full ROM both hands, wrists, and finger joints  Left wrist:  normal exam, no swelling, tenderness, instability; ligaments intact, full ROM both hands, wrists, and finger joints  Physical exam: General: Vital signs reviewed.  Patient is well-developed and well-nourished, obese male  in no acute distress and cooperative with exam. Head: Normocephalic and atraumatic. Eyes: EOMI, conjunctivae normal, no scleral icterus. Neck: Supple, trachea midline, normal ROM, no JVD, masses, thyromegaly, or carotid bruit present. Cardiovascular: RRR, S1 normal, S2 normal, no murmurs, gallops, or rubs. Pulmonary/Chest: Clear to auscultation bilaterally, no wheezes, rales, or rhonchi. Abdominal: Soft, non-tender, non-distended, BS +, no masses, organomegaly, or guarding present. Musculoskeletal: No joint deformities, erythema, or stiffness, ROM full  and nontender. Extremities: No lower extremity edema bilaterally,  pulses symmetric and intact bilaterally. No cyanosis or clubbing. Neurological: A&O x3, Strength is normal Skin: Warm, dry and intact. No rashes or erythema. Psychiatric: Normal mood and affect. speech and behavior is normal. Cognition and memory are normal.    Imaging: None problem present after removing sheet rock - over use and straining.  Assessment:  Johne was seen today for wrist pain.  Diagnoses and all orders for this visit:  Screening for diabetes mellitus -     HgB A1c 5.7  Pain in both wrists  Carpal tunnel syndrome on both sides  Resr, ice, compression, and elevation (RICE) therapy. NSAIDs per medication orders. Orthopedics referral.  Prediabetes  Your A1C is a measure of your sugar over the past 3 months and is not affected by what you have eaten over the past few days. Diabetes increases your chances of stroke and heart attack over 300 % and is the leading cause of blindness and kidney failure in the [place]. Please make sure you decrease bad carbs like white bread, white rice, potatoes, corn, soft drinks, pasta, cereals, refined sugars, sweet tea, dried fruits, and fruit juice. Good carbs are okay to eat in moderation like sweet potatoes, brown rice, whole grain pasta/bread, most fruit (except dried fruit) and you can eat as many veggies as you want.   Greater than 6.5 is considered diabetic. Between 6.4 and 5.7 is prediabetic If your A1C is less than 5.7 you are NOT diabetic.  Targets for Glucose Readings: Time of Check Target for patients WITHOUT Diabetes Target for DIABETICS  Before Meals Less than 100  less than  150  Two hours after meals Less than 200  Less than 250    Elevated blood pressure reading in office with diagnosis of hypertension Counseled on blood pressure goal of less than 130/80, low-sodium, DASH diet, medication compliance, 150 minutes of moderate intensity exercise per  wee       [name]

## 2021-11-18 ENCOUNTER — Ambulatory Visit: Payer: Self-pay | Admitting: Orthopedic Surgery

## 2022-03-13 ENCOUNTER — Ambulatory Visit (INDEPENDENT_AMBULATORY_CARE_PROVIDER_SITE_OTHER): Payer: Self-pay | Admitting: Primary Care

## 2022-04-09 ENCOUNTER — Encounter (INDEPENDENT_AMBULATORY_CARE_PROVIDER_SITE_OTHER): Payer: Self-pay | Admitting: Primary Care

## 2022-04-09 ENCOUNTER — Other Ambulatory Visit: Payer: Self-pay

## 2022-04-09 ENCOUNTER — Ambulatory Visit (INDEPENDENT_AMBULATORY_CARE_PROVIDER_SITE_OTHER): Payer: Self-pay | Admitting: Primary Care

## 2022-04-09 VITALS — BP 118/82 | HR 99 | Temp 97.9°F | Ht 63.0 in | Wt 182.6 lb

## 2022-04-09 DIAGNOSIS — M25531 Pain in right wrist: Secondary | ICD-10-CM

## 2022-04-09 DIAGNOSIS — M25532 Pain in left wrist: Secondary | ICD-10-CM

## 2022-04-09 DIAGNOSIS — M940 Chondrocostal junction syndrome [Tietze]: Secondary | ICD-10-CM

## 2022-04-09 MED ORDER — IBUPROFEN 600 MG PO TABS
600.0000 mg | ORAL_TABLET | Freq: Three times a day (TID) | ORAL | 0 refills | Status: DC | PRN
  Filled 2022-04-09: qty 60, 20d supply, fill #0

## 2022-04-09 NOTE — Progress Notes (Signed)
[facility]  Warren Owens, is a 46 y.o. male  LGX:[id number]  [id number]:[id number]  DOB - 1976-06-22  Chief Complaint  Patient presents with   Follow-up    GERD(no longer an issue) Weight  Pt is not fasting        Subjective:   [name] is a 46 y.o. male here today for a follow up visit to voice concerns about pain bilateral hand and feet. 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 Patient has No headache, No chest pain, No abdominal pain - No Nausea, No new weakness tingling or numbness, No Cough - shortness of breath  No problems updated.  Allergies  Allergen Reactions   Shellfish Allergy Anaphylaxis    Past Medical History:  Diagnosis Date   Hyperlipidemia     Current Outpatient Medications on File Prior to Visit  Medication Sig Dispense Refill   albuterol (PROVENTIL HFA;VENTOLIN HFA) 108 (90 BASE) MCG/ACT inhaler Inhale into the lungs every 6 (six) hours as needed for wheezing or shortness of breath. (Patient not taking: Reported on 07/10/2021)     omeprazole (PRILOSEC) 20 MG capsule Take 1 capsule (20 mg total) by mouth daily. (Patient not taking: Reported on 11/06/2021) 30 capsule 3   No current facility-administered medications on file prior to visit.    Objective:   Vitals:   04/09/22 1544  BP: 118/82  Pulse: 99  Temp: 97.9 F (36.6 C)  TempSrc: Oral  SpO2: 94%  Weight: 182 lb 9.6 oz (82.8 kg)  Height: 5\' 3"  (1.6 m)    Exam General appearance : Awake, alert, not in any distress. Speech Clear. Not toxic looking HEENT: Atraumatic and Normocephalic, pupils equally reactive to light and accomodation Neck: Supple, no JVD. No cervical lymphadenopathy.  Chest: Good air entry bilaterally, no added sounds  CVS: S1 S2 regular, no murmurs.  Abdomen: Bowel sounds present, Non tender and  not distended with no gaurding, rigidity or rebound. Extremities: B/L Lower Ext shows no edema, both legs are warm to touch Neurology: Awake alert, and oriented X 3, CN II-XII intact, Non focal Skin: No Rash  Data Review Lab Results  Component Value Date   HGBA1C 5.7 (A) 11/06/2021   HGBA1C 5.3 01/04/2019   HGBA1C 5.6 04/12/2015    Assessment & Plan  Jadier was seen today for follow-up.  Diagnoses and all orders for this visit:  Pain in both wrists Repetitive motion my benefit from a wrist band and OTC NSAIDS as needed   Costochondritis Explained because we were able to duplicate pain it was muscle skeletal -Costochondritis is irritation and swelling (inflammation) of the tissue that connects the ribs to the breastbone (sternum). This tissue is called cartilage. Causes? The exact cause of this condition is not always known. It results from stress on the tissue in the affected area. The cause of this stress could be: Chest injury. Exercise or activity, such as lifting.- daily job Very bad coughing   Patient have been counseled extensively about nutrition and exercise. Other issues discussed during this visit include: low cholesterol diet, weight control and daily exercise, foot care, annual eye examinations at Ophthal[facility], importance of adherence with medications and regular follow-up. We also discussed long term complications of uncontrolled diabetes and hypertension.   Return in about 3 months (around 07/10/2022) for hyperlipidemia/Prediabetes /fasting labs.  The patient was given clear instructions to go to ER  or return to medical center if symptoms don't improve, worsen or new problems develop. The patient verbalized understanding. The patient was told to call to get lab results if they haven't heard anything in the next week.   This note has been created with Education officer, environmental. Any transcriptional errors are unintentional.    [name], NP 04/13/2022, 9:58 PM

## 2022-04-09 NOTE — Patient Instructions (Addendum)
Perfil lipdico Lipid Profile Test Por qu me debo realizar esta prueba? La prueba de perfil lipdico se puede utilizar para ayudar a Therapist, occupational de tener una enfermedad cardaca. La prueba tambin se [place] para [employer] durante el tratamiento para el colesterol alto, para determinar si alcanza sus objetivos. Qu se analiza? Un perfil lipdico mide lo siguiente: Colesterol total. El colesterol es una sustancia cerosa y similar a la grasa que se encuentra en la [place]. Si su nivel de colesterol total es alto, puede aumentar el riesgo de sufrir enfermedad cardaca. Lipoprotena de alta densidad (HDL). Tambin conocida como colesterol bueno. Tener niveles altos de HDL disminuye el riesgo de sufrir enfermedad cardaca. El nivel de HDL puede ser bajo si fuma o no realiza suficiente ejercicio. Lipoprotena de baja densidad (LDL). Tambin conocida como colesterol malo. Este tipo hace que se acumulen placas en las arterias. Lo mejor es que el nivel de LDL sea bajo. Los niveles altos de LDL aumentan el riesgo de sufrir enfermedad cardaca. Proporcin colesterol/HDL. Esto se calcula mediante la divisin del colesterol total por el colesterol HDL. Los mdicos utilizan esta proporcin para determinar el riesgo de sufrir enfermedad cardaca. Lo mejor es que la proporcin sea baja. Triglicridos. Estos son grasas que el organismo puede Financial controller o quemar como fuente de [place]. Lo mejor es que el valor sea bajo. Tener niveles altos de triglicridos aumenta el riesgo de sufrir enfermedad cardaca. Qu tipo de [place] se toma?  Para esta prueba, se extrae Truddie Coco de [place]. Por lo general, para extraerla, se introduce una aguja en un vaso sanguneo. Cmo debo prepararme para esta prueba? Siga las instrucciones del mdico acerca de si debe cambiar o suspender los medicamentos que habitualmente toma. No coma ni beba nada, excepto agua, durante 9 a 12 horas antes de la prueba o segn le  haya indicado el mdico. No beba alcohol durante al menos 24 horas antes de la prueba. Siga las instrucciones del mdico respecto de las restricciones de alimentos antes de la prueba. Informe al mdico acerca de lo siguiente: Todos los [employer] [place], incluidos vitaminas, hierbas, gotas oftlmicas, cremas y medicamentos de venta libre. Cualquier afeccin mdica que tenga. Si est embarazada o podra estarlo. Cmo se informan los resultados? Los [employer] de la prueba se informan como valores que indican los niveles de colesterol y triglicridos. Su mdico comparar sus resultados con los rangos normales que se establecieron luego de Optometrist la prueba a un grupo grande de personas (rangos de referencia). Los rangos de referencia pueden variar entre distintos laboratorios y hospitales. Los rangos de referencia habituales para esta prueba son los siguientes: Colesterol total Adultos o ancianos: menos de 200 mg/dl. Nios: 120 a 200 mg/dl. HDL Hombres: ms de 45 mg/dl. Mujeres: ms de 55 mg/dl. Valores de referencia de HDL que indican su riesgo de sufrir enfermedad cardaca: Bajo riesgo de sufrir enfermedad cardaca: Hombres: 60 mg/dl. Mujeres: 70 mg/dl. Riesgo moderado de sufrir enfermedad cardaca: Hombres: 45 mg/dl. Mujeres: 55 mg/dl. Alto riesgo de sufrir enfermedad cardaca: Hombres: 25 mg/dl. Mujeres: 35 mg/dl. LDL Adultos: su mdico determinar un nivel objetivo para su nivel de LDL en funcin de su riesgo de sufrir enfermedad cardaca. Si tiene un riesgo bajo, el LDL debe ser de 130 mg/dl o menos. Si tiene un riesgo moderado, el LDL debe ser de 100 mg/dl o menos. Si tiene Electronics engineer, el LDL debe ser de 70 mg/dl o menos. Nios: menos de 110 mg/dl. Proporcin colesterol/HDL Valores de  referencia que indican su riesgo de sufrir enfermedad cardaca: Riesgo que es la mitad del riesgo promedio: Hombres: 3.4. Mujeres: 3.3. Riesgo promedio: Hombres: 5.0. Mujeres:  4.4. Riesgo que [employer] veces el riesgo promedio (riesgo moderado): Hombres: 10.0. Mujeres: 7.0. Riesgo que es tres veces el riesgo promedio (alto riesgo): Hombres: 24.0. Mujeres: 11.0. Triglicridos Adultos o ancianos: Hombres: 40 a 160 mg/dl. Mujeres: 35 a 135 mg/dl. Jvenes de 16 a 19 aos de edad: Hombres: 40 a 163 mg/dl. Mujeres: 40 a 128 mg/dl. Jvenes de 12 a 15 aos de edad: Hombres: 36 a 138 mg/dl. Mujeres: 41 a 138 mg/dl. Nios de 6 a 11 aos de edad: Hombres: 31 a 108 mg/dl. Mujeres: 35 a 114 mg/dl. Nios de 0 a 5 aos de edad: Hombres: 30 a 86 mg/dl. Mujeres: 32 a 99 mg/dl. El valor de triglicridos debe ser menos de 400 mg/dl, incluso cuando no est en ayunas. [place] significan los resultados? Los [employer] estn dentro de los rangos de Biomedical engineer se consideran normales. Los niveles totales de colesterol, LDL y triglicridos superiores a los rangos normales pueden significar que usted tiene ms riesgo de sufrir enfermedad cardaca. Un nivel de HDL que sea ms bajo que el rango de referencia tambin puede indicar un aumento en el riesgo. Hable con su mdico sobre lo que significan sus [place]. Preguntas para hacerle al mdico Consulte a su mdico o pregunte en el departamento donde se realiza la prueba acerca de lo siguiente: Cundo estarn disponibles mis resultados? Cmo obtendr mis resultados? Cules son las opciones de tratamiento? Qu otras pruebas necesito? Cules son los prximos pasos que debo seguir? Resumen La prueba de perfil lipdico puede ayudar a predecir la probabilidad de que usted tenga una enfermedad cardaca. [place], puede ayudar a Pil[employer] de colesterol durante el tratamiento. Un perfil lipdico mide el nivel total de colesterol, de lipoprotenas de alta densidad (HDL), lipoprotenas de baja densidad (LDL), la proporcin colesterol-HDL y los triglicridos. Los niveles totales de colesterol, LDL y triglicridos superiores a  los rangos normales pueden indicar un mayor riesgo de sufrir enfermedad cardaca. Un nivel de HDL que sea ms bajo que el rango de referencia puede indicar un aumento en el riesgo de sufrir enfermedad cardaca. Hable con su mdico sobre lo que significan sus [place]. Esta informacin no tiene Marine scientist el consejo del mdico. Asegrese de hacerle al mdico cualquier pregunta que tenga. Document Revised: 02/28/2021 Document Reviewed: 02/28/2021 Elsevier Patient Education  [place] costocondritis es la irritacin e hinchazn (inflamacin) del tejido que une las costillas con el esternn. Este tejido se TEFL teacher. La costocondritis causa dolor en la parte [place]. Por lo general, el dolor tiene las siguientes caractersticas: Comienza lentamente. Se manifiesta en ms de una costilla. Cules son las causas? No siempre se conoce la causa exacta de esta afeccin. Es el resultado de una sobrecarga en el tejido de la zona afectada. La causa de esta sobrecarga puede ser la siguiente: Lesin torcica. Ejercicios o actividades relacionadas con [employer]. Tos muy intensa. Qu incrementa el riesgo? Es ms probable que tenga esta afeccin si: Es mujer. Tiene entre 25 y 87 aos. Comenz un nuevo ejercicio o una nueva actividad recientemente. Tiene niveles bajos de vitamina D. Tiene una afeccin que lo hace toser con frecuencia. Cules son los signos o sntomas? El sntoma principal de esta afeccin es el dolor en el pecho. El dolor: Por lo general, comienza de Geographical information systems officer lenta y  puede ser penetrante o sordo. Empeora al respirar, toser o hacer ejercicio. Mejora con el reposo. Puede empeorar al presionar la zona afectada de las costillas y el esternn. Cmo se trata? Generalmente, esta afeccin desaparece con el paso tiempo, sin tratamiento. El mdico puede recetarle un antiinflamatorio no esteroideo (AINE), como  ibuprofeno. Esto puede ayudar a Risk manager y Conservation officer, historic buildings. El tratamiento tambin puede incluir lo siguiente: Field seismologist reposo y Product/process development scientist las actividades que empeoren el dolor. Aplicar calor o hielo sobre la zona dolorida. Hacer ejercicios para elongar los msculos del pecho. Si estos tratamientos no ayudan, el mdico puede inyectarle un anestsico para Best boy. Siga estas instrucciones en su casa: Control del dolor, la rigidez y la hinchazn     Si se lo indican, aplique hielo sobre la zona dolorida. Para hacer esto: Ponga el hielo en una bolsa plstica. Coloque una toalla entre la piel y Therapist, nutritional. Aplique el hielo durante 20 minutos, 2 a 3 veces por da. Si se lo indican, aplique calor en la zona afectada. Hgalo con la frecuencia que le haya indicado el mdico. Use la fuente de calor que el mdico le recomiende, como una compresa de calor hmedo o una almohadilla trmica. Coloque una toalla entre la piel y la fuente de Freight forwarder. Aplique calor durante 20 a 30 minutos. Retire la fuente de calor si la piel se le pone de color rojo brillante. Esto es muy importante si no puede sentir el dolor, el calor o el fro. Puede correr un riesgo mayor de sufrir quemaduras. Actividad Haga reposo como se lo haya indicado el mdico. No haga nada que intensifique el dolor. Esto incluye cualquier [employer] msculos del [place], del vientre (abdomen) y los laterales. No levante ningn objeto que pese ms de 10 libras (4.5 kg) o el lmite de peso que le indiquen hasta que el mdico le diga que puede [place]. Retome sus actividades normales segn lo indicado por el mdico. Pregntele al mdico qu actividades son seguras para usted. Instrucciones generales [employer] de venta libre y los recetados solamente como se lo haya indicado el mdico. Consulting civil engineer a todas las visitas de seguimiento como se lo haya indicado el mdico. Esto es importante. Comunquese con un mdico  si: Siente escalofros o tiene fiebre. El dolor persiste o [place]. Tiene tos que no desaparece. Solicite ayuda de inmediato si: Le falta el aire. Siente dolor de pecho muy fuerte que no mejora con medicamentos, calor ni hielo. Estos sntomas pueden Sales executive. No espere a ver si los sntomas desaparecen. Solicite atencin mdica de inmediato. Comunquese con el servicio de emergencias de su localidad (911 en los [place]). No conduzca por sus propios medios [employer] hospital. Resumen La costocondritis es la irritacin e hinchazn (inflamacin) del tejido que une las costillas con el esternn. Esta afeccin causa dolor en la parte frontal del pecho. El tratamiento puede incluir medicamentos, reposo, Freight forwarder o hielo, y ejercicios. Esta informacin no tiene Marine scientist el consejo del mdico. Asegrese de hacerle al mdico cualquier pregunta que tenga. Document Revised: 12/02/2019 Document Reviewed: 12/02/2019 Elsevier Patient Education  [place] de alimentacin cardiosaludable Heart-Healthy Eating Plan Muchos factores influyen en la salud del corazn (coronaria), entre ellos, los hbitos de alimentacin y de ejercicio fsico. El riesgo coronario aumenta cuando hay niveles anormales de grasa (lpidos) en la [place]. La planificacin de las comidas cardiosaludables implica limitar las grasas poco saludables, [place] grasas saludables y Field seismologist  otros cambios en la dieta y el estilo de vida. En qu consiste el plan? El mdico podra recomendarle que haga lo siguiente: Limitar la ingesta de grasas al _________ % o menos del total de caloras por da. Limitar la ingesta de grasas saturadas al _________ % o menos del total de caloras por da. Limitar la cantidad de colesterol en su dieta a menos de _________ mg por da. Cules son algunos consejos para seguir este plan? Al cocinar Evite frer los alimentos a la hora de la coccin. Hornear, hervir, grillar  y asar a la parrilla son buenas opciones. Otras formas de reducir el consumo de grasas son las siguientes: Quite la piel de las aves. Quite todas las grasas visibles de las carnes. Cocine al vapor las verduras en agua o caldo. Planificacin de las comidas  En las comidas, imagine dividir su plato en cuartos: Llene la mitad del plato con verduras y ensaladas de hojas verdes. Llene un cuarto del plato con cereales integrales. Llene un cuarto del plato con alimentos con protenas magras. Coma 4 o 5 porciones de verduras por da. Una porcin equivale a una taza de verduras crudas o cocidas o a 2 tazas de verduras de hojas verdes crudas. Coma 4 o 5 porciones de frutas por da. Una porcin equivale a una fruta mediana entera,  taza de fruta desecada;  taza de frutas frescas, congeladas o enlatadas; o  taza de jugo 100 % de fruta. Consuma ms alimentos con fibra soluble. Entre ellos, se incluyen las [place], el [place], las [place], los frijoles, los guisantes y Nature conservation officer. Trate de consumir de 25 a 30 g de [employer]. Aumente el consumo de legumbres, frutos secos y semillas a 4 o 5 porciones por semana. Una porcin de frijoles o legumbres secos equivale a  taza despus de su coccin, una porcin de frutos secos equivale a  de taza y [place] porcin de semillas equivale a 1 cucharada. Grasas Elija grasas saludables con mayor frecuencia. Elija las grasas monoinsaturadas y poliinsaturadas, como el aceite de oliva y el de canola, las semillas de [place], las nueces, las almendras y las semillas. Consuma ms grasas omega-3. Elija salmn, caballa, sardinas, atn, aceite de lino y semillas de lino molidas. Propngase comer pescado al [employer] veces por semana. Lea las etiquetas de los alimentos detenidamente para identificar los que contienen grasas trans o altas cantidades de grasas saturadas. Limite el consumo de grasas saturadas. Estas se encuentran en productos de origen animal, como carnes,  mantequilla y crema. Las grasas saturadas de origen vegetal incluyen aceite de palma, de palmiste y de coco. Evite los alimentos con aceites parcialmente hidrogenados. Estos contienen grasas trans. [address] [place], se incluyen margarinas en barra, algunas margarinas untables, galletas dulces y [place] y otros productos horneados. Evite las comidas fritas. Informacin general Consuma ms comida casera y menos de restaurante, de bares y comida rpida. Limite o evite el alcohol. Limite los alimentos con alto contenido de almidn y Location manager. Baje de peso si es necesario. Perder solo del 5 al 10 % de su peso corporal puede ayudarlo a mejorar su estado de salud general y a Producer, television/film/video, como la diabetes y las enfermedades cardacas. Controle la ingesta de sal (sodio), especialmente si tiene presin arterial alta. Hable con el mdico acerca de cambiar la ingesta de sodio. Intente incorporar ms comidas vegetarianas cada semana. Qu alimentos puedo comer? Lambert Mody Lambert Mody frescas, en conserva (en su jugo natural) o frutas congeladas. Verduras Verduras frescas o  congeladas (crudas, al vapor, asadas o grilladas). Ensaladas de hojas verdes. Cereales La mayora de los cereales. Elija casi siempre trigo integral y Psychologist, prison and probation services. Arroz y pastas, incluido el arroz integral y las pastas elaboradas con trigo integral. Estate agent y otras protenas Carnes magras de res, ternera, cerdo y cordero a las que se les haya quitado la grasa visible. Pollo y pavo sin piel. Todos los pescados y [employer]. Pato salvaje, conejo, faisn y venado. Claras de huevo o sustitutos del huevo bajos en colesterol. Porotos, guisantes y lentejas secos y tofu. Semillas y la mayora de los frutos secos. Lcteos Quesos descremados y semidescremados, entre ellos, ricota y Artist. Leche descremada o al 1 % (lquida, en polvo o evaporada). [place]. Yogur descremado o semidescremado. Grasas  y Naval architect no hidrogenadas (sin grasas trans). Aceites vegetales, incluido el de soja, ssamo, girasol, [place], man, crtamo, maz, canola y semillas de algodn. Alios para ensalada o mayonesa elaborados con aceite vegetal. Billie Lade (mineral o con gas). T y caf. Gaseosas dietticas. Dulces y [employer], gelatina y helado de frutas. Pequeas cantidades de chocolate amargo. Limite todos los dulces y postres. Alios y [employer] y condimentos. Es posible que los productos que se enumeran ms [place] no constituyan una lista completa de los alimentos y las bebidas que puede tomar. Consulte a un nutricionista para conocer ms opciones. Qu alimentos no se recomiendan? Lambert Mody Fruta enlatada en almbar espeso. Frutas con salsa de crema o mantequilla. Frutas cocidas en aceite. Limite el consumo de coco. Verduras Verduras cocinadas con salsas de queso, crema o mantequilla. Verduras fritas. Cereales Panes elaborados con grasas saturadas o trans, aceites o Mattel. Croissants. Panecillos dulces. [place]. Galletas con alto contenido de grasas, como las que contienen [place]. Carnes y otras protenas Carnes grasas, como perros calientes, [place] de res, salchichas, tocino, asado de Engineer, structural o [place]. Fiambres con alto contenido de [place], como salame y Archivist. Caviar. Pato y ganso domsticos. Vsceras, como hgado. Lcteos Crema, crema agria, queso crema y [place] cottage con crema. Quesos enteros. Leche entera o al 2 % (lquida, evaporada o condensada). Suero de [employer]. Salsa de crema o queso con alto contenido de [place]. [place] Grasas y [place] de carne o materia grasa. [place] de cacao, aceites hidrogenados, aceite de palma, aceite de coco, aceite de palmiste. Grasas y materias grasas slidas, incluida la grasa del tocino, el cerdo [place], la [place] de cerdo y Community education officer. Sustitutos no lcteos de la crema. Aderezos para  ensalada con queso o crema agria. Bebidas Refrescos regulares y cualquier bebida con agregado de azcar. Dulces y [employer]. Pudin. Galletas dulces. Bizcochuelos. Pasteles. Chocolate con leche o chocolate blanco. Almbares con mantequilla. Helados o bebidas elaboradas con helado con alto contenido de grasas. Es posible que los productos que se enumeran ms arriba no sean una lista completa de los alimentos y las bebidas que se Higher education careers adviser. Consulte a un nutricionista para obtener ms informacin. Resumen La planificacin de las comidas cardiosaludables implica limitar las grasas poco saludables, aumentar las grasas saludables y Field seismologist otros cambios en la dieta y el estilo de [place]. Baje de peso si es necesario. Perder solo del 5 al 10 % de su peso corporal puede ayudarlo a mejorar su estado de salud general y a Producer, television/film/video, como la diabetes y las enfermedades cardacas. Propngase seguir una dieta equilibrada, que incluya frutas y verduras, productos lcteos descremados o semidescremados, protenas magras, frutos secos  y legumbres, cereales integrales y aceites y grasas cardiosaludables. Esta informacin no tiene Marine scientist el consejo del mdico. Asegrese de hacerle al mdico cualquier pregunta que tenga. Document Revised: 03/08/2021 Document Reviewed: 03/08/2021 Elsevier Patient Education  2022 [place]. Costochondritis  Costochondritis is irritation and swelling (inflammation) of the tissue that connects the ribs to the breastbone (sternum). This tissue is called cartilage. Costochondritis causes pain in the front of the chest. Usually, the pain: Starts slowly. Is in more than one rib. What are the causes? The exact cause of this condition is not always known. It results from stress on the tissue in the affected area. The cause of this stress could be: Chest injury. Exercise or activity, such as lifting. Very bad coughing. What increases the risk? You are  more likely to develop this condition if you: Are male. Are 25-90 years old. Recently started a new exercise or work activity. Have low levels of vitamin D. Have a condition that makes you cough often. What are the signs or symptoms? The main symptom of this condition is chest pain. The pain: Usually starts slowly and can be sharp or dull. Gets worse with deep breathing, coughing, or exercise. Gets better with rest. May be worse when you press on the affected area of your ribs and breastbone. How is this treated? This condition usually goes away on its own over time. Your doctor may prescribe an NSAID, such as ibuprofen. This can help reduce pain and inflammation. Treatment may also include: Resting and avoiding activities that make pain worse. Putting heat or ice on the painful area. Doing exercises to stretch your chest muscles. If these treatments do not help, your doctor may inject a numbing medicine to help relieve the pain. Follow these instructions at home: Managing pain, stiffness, and swelling     If told, put ice on the painful area. To do this: Put ice in a plastic bag. Place a towel between your skin and the bag. Leave the ice on for 20 minutes, 2-3 times a day. If told, put heat on the affected area. Do this as often as told by your doctor. Use the heat source that your doctor recommends, such as a moist heat pack or a heating pad. Place a towel between your skin and the heat source. Leave the heat on for 20-30 minutes. Take off the heat if your skin turns bright red. This is very important if you cannot feel pain, heat, or cold. You may have a greater risk of getting burned. Activity Rest as told by your doctor. Do not do anything that makes your pain worse. This includes any activities that use chest, belly (abdomen), and side muscles. Do not lift anything that is heavier than 10 lb (4.5 kg), or the limit that you are told, until your doctor says that it is  safe. Return to your normal activities as told by your doctor. Ask your doctor what activities are safe for you. General instructions Take over-the-counter and prescription medicines only as told by your doctor. Keep all follow-up visits as told by your doctor. This is important. Contact a doctor if: You have chills or a fever. Your pain does not go away or it gets worse. You have a cough that does not go away. Get help right away if: You are short of breath. You have very bad chest pain that is not helped by medicines, heat, or ice. These symptoms may be an emergency. Do not wait to see if  the symptoms will go away. Get medical help right away. Call your local emergency services (911 in the [place] Do not drive yourself to the hospital. Summary Costochondritis is irritation and swelling (inflammation) of the tissue that connects the ribs to the breastbone (sternum). This condition causes pain in the front of the chest. Treatment may include medicines, rest, heat or ice, and exercises. This information is not intended to replace advice given to you by your health care provider. Make sure you discuss any questions you have with your health care provider. Document Revised: 09/09/2019 Document Reviewed: 09/09/2019 Elsevier Patient Education  [place].

## 2022-04-16 ENCOUNTER — Other Ambulatory Visit: Payer: Self-pay

## 2022-04-17 ENCOUNTER — Other Ambulatory Visit: Payer: Self-pay

## 2022-04-17 ENCOUNTER — Emergency Department (HOSPITAL_COMMUNITY)
Admission: EM | Admit: 2022-04-17 | Discharge: 2022-04-18 | Disposition: A | Discharge status: Home / Self Care | Payer: Self-pay | Attending: Emergency Medicine | Admitting: Emergency Medicine

## 2022-04-17 ENCOUNTER — Encounter (HOSPITAL_COMMUNITY): Payer: Self-pay

## 2022-04-17 ENCOUNTER — Emergency Department (HOSPITAL_COMMUNITY): Payer: Self-pay

## 2022-04-17 DIAGNOSIS — R079 Chest pain, unspecified: Secondary | ICD-10-CM | POA: Insufficient documentation

## 2022-04-17 DIAGNOSIS — R0602 Shortness of breath: Secondary | ICD-10-CM | POA: Insufficient documentation

## 2022-04-17 DIAGNOSIS — R350 Frequency of micturition: Secondary | ICD-10-CM | POA: Insufficient documentation

## 2022-04-17 DIAGNOSIS — R059 Cough, unspecified: Secondary | ICD-10-CM | POA: Insufficient documentation

## 2022-04-17 LAB — CBC
HCT: 45.8 % (ref 39.0–52.0)
Hemoglobin: 16.1 g/dL (ref 13.0–17.0)
MCH: 31.1 pg (ref 26.0–34.0)
MCHC: 35.2 g/dL (ref 30.0–36.0)
MCV: 88.6 fL (ref 80.0–100.0)
Platelets: 296 10*3/uL (ref 150–400)
RBC: 5.17 MIL/uL (ref 4.22–5.81)
RDW: 11.8 % (ref 11.5–15.5)
WBC: 10.2 10*3/uL (ref 4.0–10.5)
nRBC: 0 % (ref 0.0–0.2)

## 2022-04-17 LAB — URINALYSIS, ROUTINE W REFLEX MICROSCOPIC
Bacteria, UA: NONE SEEN
Bilirubin Urine: NEGATIVE
Glucose, UA: NEGATIVE mg/dL
Ketones, ur: NEGATIVE mg/dL
Leukocytes,Ua: NEGATIVE
Nitrite: NEGATIVE
Protein, ur: NEGATIVE mg/dL
Specific Gravity, Urine: 1.009 (ref 1.005–1.030)
pH: 5 (ref 5.0–8.0)

## 2022-04-17 LAB — CBG MONITORING, ED: Glucose-Capillary: 125 mg/dL — ABNORMAL HIGH (ref 70–99)

## 2022-04-17 LAB — BASIC METABOLIC PANEL
Anion gap: 8 (ref 5–15)
BUN: 21 mg/dL — ABNORMAL HIGH (ref 6–20)
CO2: 23 mmol/L (ref 22–32)
Calcium: 9.1 mg/dL (ref 8.9–10.3)
Chloride: 107 mmol/L (ref 98–111)
Creatinine, Ser: 1.13 mg/dL (ref 0.61–1.24)
GFR, Estimated: >60 mL/min (ref >60)
Glucose, Bld: 97 mg/dL (ref 70–99)
Potassium: 3.6 mmol/L (ref 3.5–5.1)
Sodium: 138 mmol/L (ref 135–145)

## 2022-04-17 LAB — HEPATIC FUNCTION PANEL
ALT: 32 U/L (ref 0–44)
AST: 30 U/L (ref 15–41)
Albumin: 4 g/dL (ref 3.5–5.0)
Alkaline Phosphatase: 93 U/L (ref 38–126)
Bilirubin, Direct: 0.2 mg/dL (ref 0.0–0.2)
Indirect Bilirubin: 0.6 mg/dL (ref 0.3–0.9)
Total Bilirubin: 0.8 mg/dL (ref 0.3–1.2)
Total Protein: 7.6 g/dL (ref 6.5–8.1)

## 2022-04-17 LAB — TROPONIN I (HIGH SENSITIVITY)
Troponin I (High Sensitivity): 4 ng/L (ref <18)
Troponin I (High Sensitivity): 4 ng/L (ref <18)

## 2022-04-17 LAB — LIPASE, BLOOD: Lipase: 32 U/L (ref 11–51)

## 2022-04-17 MED ORDER — LIDOCAINE VISCOUS HCL 2 % MT SOLN
15.0000 mL | Freq: Once | OROMUCOSAL | Status: AC
  Administered 2022-04-17: 15 mL via ORAL
  Filled 2022-04-17: qty 15

## 2022-04-17 MED ORDER — ALUM & MAG HYDROXIDE-SIMETH 200-200-20 MG/5ML PO SUSP
30.0000 mL | Freq: Once | ORAL | Status: AC
  Administered 2022-04-17: 30 mL via ORAL
  Filled 2022-04-17: qty 30

## 2022-04-17 MED ORDER — IOHEXOL 350 MG/ML SOLN
100.0000 mL | Freq: Once | INTRAVENOUS | Status: AC | PRN
  Administered 2022-04-17: 100 mL via INTRAVENOUS

## 2022-04-17 NOTE — ED Triage Notes (Signed)
Patient reports intermittent mid chest pain x 4 or more weeks, but today was worse than usual.Patient denies SOB, but states last night when he had chest pain he was "sweaty."  Patient also c/o dry mouth and urinating frequently. Patient also reports that he has had a productive cough that is white and sometimes green in color x 1 week.

## 2022-04-17 NOTE — ED Provider Notes (Signed)
[facility]ENCY DEPT [facility] Emergency Department Provider Note MRN:  [id number]  Arrival date & time: 04/18/22     Chief Complaint   Chest Pain, Cough, and Urinary Frequency   History of Present Illness   Warren Owens is a 46 y.o. year-old male presents to the ED with chief complaint of chest pain.  He states that he has been having intermittent episodes for about 4 weeks.  He states that today the pain was worse than normal.  He states that it lasted for approximately 5 minutes.  He points to the center of his chest when it occurred.  Denies having associated shortness of breath.  He denies any fever or chills.  States has had some dry mouth and cough.  He denies abdominal pain, nausea, or vomiting.  Triage and MSE not mention urinary frequency, but he denies urinary complaints to me.  History provided by patient. Professional Spanish interpreter used through [employer].  Review of Systems  Pertinent review of systems noted in HPI.    Physical Exam   Vitals:   04/18/22 0030 04/18/22 0047  BP: 111/90   Pulse: 69   Resp: 15   Temp:  98.2 F (36.8 C)  SpO2: 95%     CONSTITUTIONAL:  Well-appearing, NAD NEURO:  Alert and oriented x 3, CN 3-12 grossly intact EYES:  eyes equal and reactive ENT/NECK:  Supple, no stridor  CARDIO:  normal rate, regular rhythm, appears well-perfused  PULM:  No respiratory distress, CTAB GI/GU:  non-distended,  MSK/SPINE:  No gross deformities, no edema, moves all extremities  SKIN:  no rash, atraumatic   *Additional and/or pertinent findings included in MDM below  Diagnostic and Interventional Summary    EKG Interpretation  Date/Time:  Thursday April 17 2022 23:04:59 EDT Ventricular Rate:  64 PR Interval:  160 QRS Duration: 95 QT Interval:  407 QTC Calculation: 420 R Axis:   43 Text Interpretation: Sinus rhythm Abnormal R-wave progression, early transition LVH with repol Confirmed by Nicanor Alcon, April ([id number])  on 04/17/2022 11:22:56 PM       Labs Reviewed  BASIC METABOLIC PANEL - Abnormal; Notable for the following components:      Result Value   BUN 21 (*)    All other components within normal limits  URINALYSIS, ROUTINE W REFLEX MICROSCOPIC - Abnormal; Notable for the following components:   Color, Urine STRAW (*)    Hgb urine dipstick SMALL (*)    All other components within normal limits  CBG MONITORING, ED - Abnormal; Notable for the following components:   Glucose-Capillary 125 (*)    All other components within normal limits  CBC  LIPASE, BLOOD  HEPATIC FUNCTION PANEL  TROPONIN I (HIGH SENSITIVITY)  TROPONIN I (HIGH SENSITIVITY)    CT Angio Chest PE W and/or Wo Contrast  Final Result    DG Chest 2 View  Final Result      Medications  alum & mag hydroxide-simeth (MAALOX/MYLANTA) 200-200-20 MG/5ML suspension 30 mL (30 mLs Oral Given 04/17/22 2305)    And  lidocaine (XYLOCAINE) 2 % viscous mouth solution 15 mL (15 mLs Oral Given 04/17/22 2305)  iohexol (OMNIPAQUE) 350 MG/ML injection 100 mL (100 mLs Intravenous Contrast Given 04/17/22 2349)     Procedures  /  Critical Care Procedures  ED Course and Medical Decision Making  I have reviewed the triage vital signs, the nursing notes, and pertinent available records from the EMR.  Social Determinants Affecting Complexity of Care: Patient has no clinically  significant social determinants affecting this chief complaint..   ED Course:   Patient here with chest pains that have been intermittent for 4 weeks.  Top differential diagnoses include ACS, PE, GERD, PUD. Medical Decision Making Patient here with intermittent chest pains for the past 4 weeks.  States the pain lasts for about 5 minutes when it comes on.  He denies any association with exertion.  He is afebrile.  He is not hypoxic.  Vital signs are reassuring.  Laboratory work-up discussed below.  Problems Addressed: Chest pain, unspecified type: undiagnosed new problem with  uncertain prognosis  Amount and/or Complexity of Data Reviewed Labs: ordered.    Details: Normal troponins x2, no electrolyte derangement, no leukocytosis Radiology: ordered and independent interpretation performed.    Details: No opacity seen on chest x-ray ECG/medicine tests: ordered.    Details: s1q3t3 seen on EKG  Risk OTC drugs. Prescription drug management.     Consultants: No consultations were needed in caring for this patient.   Treatment and Plan: I considered admission due to patient's initial presentation, but after considering the examination and diagnostic results, patient will not require admission and can be discharged with outpatient follow-up.  Patient discussed with attending physician, Dr. Nicanor Alcon, who recommends r/o for PE based on s1q3t3 in the setting of chest pain.  Fortunately, the CT returned as normal, will plan for discharge with PCP follow-up.  Will trial carafate.  Final Clinical Impressions(s) / ED Diagnoses     ICD-10-CM   1. Chest pain, unspecified type  R07.9       ED Discharge Orders          Ordered    sucralfate (CARAFATE) 1 g tablet  3 times daily with meals & bedtime        04/18/22 0047              Discharge Instructions Discussed with and Provided to Patient:     Discharge Instructions      No certain cause of your chest pains was found tonight.  It doesn't appear to be your heart.  You don't have a blood clot.  Please follow-up with your doctor.       Roxy Horseman, PA-C 04/18/22 0050    Palumbo, April, MD 04/18/22 [id number]

## 2022-04-17 NOTE — ED Provider Triage Note (Signed)
Emergency Medicine Provider Triage Evaluation Note  Warren Owens , a 46 y.o. male  was evaluated in triage.  Pt complains of chest pain, frequent urination, and productive cough.  Patient states the chest pain has been ongoing for approximately 4 weeks.  He states it is intermittent and has been felt in different areas of his chest, currently on the right side.  It is worse with movement, and tender to palpation.  The patient states that he has intermittent dry mouth and frequent urination.  Denies excess thirst or hunger.  Does endorse a diet change to a more healthy diet with less sugary drinks and more water.  Patient also states that for the past week he has had a productive cough with white and occasionally green sputum.  Denies shortness of breath, abdominal pain, nausea, vomiting  Review of Systems  Positive: Chest pain, urinary frequency, cough Negative: Shortness of breath, Abdominal pain, nausea, vomiting  Physical Exam  BP 126/85 (BP Location: Right Arm)   Pulse 89   Temp 98.1 F (36.7 C)   Resp 18   Ht 5\' 3"  (1.6 m)   Wt 82.8 kg   SpO2 95%   BMI 32.35 kg/m  Gen:   Awake, no distress   Resp:  Normal effort  MSK:   Moves extremities without difficulty  Other:    Medical Decision Making  Medically screening exam initiated at 5:54 PM.  Appropriate orders placed.  Wilford Twombly was informed that the remainder of the evaluation will be completed by another provider, this initial triage assessment does not replace that evaluation, and the importance of remaining in the ED until their evaluation is complete.     , PA-C 04/17/22 1756

## 2022-04-18 ENCOUNTER — Other Ambulatory Visit: Payer: Self-pay

## 2022-04-18 MED ORDER — SUCRALFATE 1 G PO TABS
1.0000 g | ORAL_TABLET | Freq: Three times a day (TID) | ORAL | 0 refills | Status: DC
  Filled 2022-04-18: qty 120, 30d supply, fill #0

## 2022-04-18 NOTE — ED Notes (Signed)
Patient verbalizes understanding of discharge instructions. Opportunity for questioning and answers were provided. Armband removed by staff, pt discharged from ED to home via POV  

## 2022-04-18 NOTE — Discharge Instructions (Addendum)
No certain cause of your chest pains was found tonight.  It doesn't appear to be your heart.  You don't have a blood clot.  Please follow-up with your doctor.

## 2022-05-23 ENCOUNTER — Other Ambulatory Visit: Payer: Self-pay

## 2022-07-15 ENCOUNTER — Ambulatory Visit (INDEPENDENT_AMBULATORY_CARE_PROVIDER_SITE_OTHER): Payer: Self-pay | Admitting: Primary Care

## 2023-03-31 ENCOUNTER — Ambulatory Visit (INDEPENDENT_AMBULATORY_CARE_PROVIDER_SITE_OTHER): Payer: Self-pay | Admitting: Primary Care

## 2023-03-31 ENCOUNTER — Encounter (INDEPENDENT_AMBULATORY_CARE_PROVIDER_SITE_OTHER): Payer: Self-pay | Admitting: Primary Care

## 2023-03-31 VITALS — BP 122/84 | HR 84 | Resp 16 | Ht 63.0 in | Wt 178.8 lb

## 2023-03-31 DIAGNOSIS — E785 Hyperlipidemia, unspecified: Secondary | ICD-10-CM

## 2023-03-31 DIAGNOSIS — Z1211 Encounter for screening for malignant neoplasm of colon: Secondary | ICD-10-CM

## 2023-03-31 DIAGNOSIS — R7303 Prediabetes: Secondary | ICD-10-CM

## 2023-03-31 DIAGNOSIS — K0889 Other specified disorders of teeth and supporting structures: Secondary | ICD-10-CM

## 2023-03-31 NOTE — Patient Instructions (Signed)

## 2023-03-31 NOTE — Progress Notes (Signed)
[facility]  Warren Owens, is a 47 y.o. male  [id number]  [id number]  DOB - 1976/06/20  Chief Complaint  Patient presents with   Dental Pain       Subjective:   Warren Owens is a 47 y.o. male here today for a acute  visit.  He is complaining of dental pain- no difficulty chewing but sensitive to temperature changes. Patient has No headache, No chest pain, No abdominal pain - No Nausea, No new weakness tingling or numbness, No Cough - shortness of breath  No problems updated.  Allergies  Allergen Reactions   Shellfish Allergy Anaphylaxis    Past Medical History:  Diagnosis Date   Hyperlipidemia     Current Outpatient Medications on File Prior to Visit  Medication Sig Dispense Refill   albuterol (PROVENTIL HFA;VENTOLIN HFA) 108 (90 BASE) MCG/ACT inhaler Inhale into the lungs every 6 (six) hours as needed for wheezing or shortness of breath. (Patient not taking: Reported on 07/10/2021)     ibuprofen (ADVIL) 600 MG tablet Take 1 tablet (600 mg total) by mouth every 8 (eight) hours as needed. 60 tablet 0   omeprazole (PRILOSEC) 20 MG capsule Take 1 capsule (20 mg total) by mouth daily. (Patient not taking: Reported on 11/06/2021) 30 capsule 3   sucralfate (CARAFATE) 1 g tablet Take 1 tablet (1 g total) by mouth 4 (four) times daily -  with meals and at bedtime. 120 tablet 0   No current facility-administered medications on file prior to visit.    Objective:   Vitals:   03/31/23 1026  BP: 122/84  Pulse: 84  Resp: 16  SpO2: 99%  Weight: 178 lb 12.8 oz (81.1 kg)  Height: 5\' 3"  (1.6 m)    Comprehensive ROS Pertinent positive and negative noted in HPI   Exam General appearance : Awake, alert, not in any distress. Speech Clear. Not toxic looking HEENT: Atraumatic and Normocephalic, pupils equally reactive to light and accomodation Neck: Supple, no JVD. No cervical lymphadenopathy.  Chest: Good air entry bilaterally,  no added sounds  CVS: S1 S2 regular, no murmurs.  Abdomen: Bowel sounds present, Non tender and not distended with no gaurding, rigidity or rebound. Extremities: B/L Lower Ext shows no edema, both legs are warm to touch Neurology: Awake alert, and oriented X 3, CN II-XII intact, Non focal Skin: No Rash  Data Review Lab Results  Component Value Date   HGBA1C 5.7 (A) 11/06/2021   HGBA1C 5.3 01/04/2019   HGBA1C 5.6 04/12/2015    Assessment & Plan   Warren Owens was seen today for dental pain.  Diagnoses and all orders for this visit:  Colon cancer screening -     Fecal occult blood, imunochemical  Dyslipidemia  Healthy lifestyle diet of fruits vegetables fish nuts whole grains and low saturated fat . Foods high in cholesterol or liver, fatty meats,cheese, butter avocados, nuts and seeds, chocolate and fried foods. -     Lipid Panel -     CMP14+EGFR  Prediabetes Patient have been counseled extensively about nutrition and exercise. Other issues discussed during this visit include: low cholesterol diet, weight control and daily exercise, foot care, annual eye examinations at Ophthalmology, importance of adherence with medications and regular follow-up. We also discussed long term complications of uncontrolled diabetes and hypertension.   Pain, dental  Refer to dentist and pt to also call around for a dentist that can make payment arrangements  Return in about 4 months (around 08/01/2023).  The  patient was given clear instructions to go to ER or return to medical center if symptoms don't improve, worsen or new problems develop. The patient verbalized understanding. The patient was told to call to get lab results if they haven't heard anything in the next week.   This note has been created with Education officer, environmental. Any transcriptional errors are unintentional.  - educated on lifestyle modifications, including but not limited to diet choices and  adding exercise to daily routine.   [name], NP 03/31/2023, 1:41 PM

## 2023-04-01 LAB — CMP14+EGFR
ALT: 33 IU/L (ref 0–44)
AST: 26 IU/L (ref 0–40)
Albumin/Globulin Ratio: 1.6 (ref 1.2–2.2)
Albumin: 4.3 g/dL (ref 4.1–5.1)
Alkaline Phosphatase: 111 IU/L (ref 44–121)
BUN/Creatinine Ratio: 18 (ref 9–20)
BUN: 16 mg/dL (ref 6–24)
Bilirubin Total: 0.6 mg/dL (ref 0.0–1.2)
CO2: 21 mmol/L (ref 20–29)
Calcium: 9.3 mg/dL (ref 8.7–10.2)
Chloride: 103 mmol/L (ref 96–106)
Creatinine, Ser: 0.89 mg/dL (ref 0.76–1.27)
Globulin, Total: 2.7 g/dL (ref 1.5–4.5)
Glucose: 84 mg/dL (ref 70–99)
Potassium: 4 mmol/L (ref 3.5–5.2)
Sodium: 139 mmol/L (ref 134–144)
Total Protein: 7 g/dL (ref 6.0–8.5)
eGFR: 106 mL/min/{1.73_m2} (ref >59)

## 2023-04-01 LAB — LIPID PANEL
Chol/HDL Ratio: 4.6 ratio (ref 0.0–5.0)
Cholesterol, Total: 216 mg/dL — ABNORMAL HIGH (ref 100–199)
HDL: 47 mg/dL (ref >39)
LDL Chol Calc (NIH): 136 mg/dL — ABNORMAL HIGH (ref 0–99)
Triglycerides: 183 mg/dL — ABNORMAL HIGH (ref 0–149)
VLDL Cholesterol Cal: 33 mg/dL (ref 5–40)

## 2023-04-01 LAB — HEMOGLOBIN A1C
Est. average glucose Bld gHb Est-mCnc: 114 mg/dL
Hgb A1c MFr Bld: 5.6 % (ref 4.8–5.6)

## 2023-04-03 ENCOUNTER — Other Ambulatory Visit: Payer: Self-pay

## 2023-04-03 ENCOUNTER — Telehealth (INDEPENDENT_AMBULATORY_CARE_PROVIDER_SITE_OTHER): Payer: Self-pay

## 2023-04-03 ENCOUNTER — Other Ambulatory Visit (INDEPENDENT_AMBULATORY_CARE_PROVIDER_SITE_OTHER): Payer: Self-pay | Admitting: Primary Care

## 2023-04-03 MED ORDER — PRAVASTATIN SODIUM 80 MG PO TABS
80.0000 mg | ORAL_TABLET | Freq: Every day | ORAL | 1 refills | Status: DC
  Filled 2023-04-03: qty 30, 30d supply, fill #0
  Filled 2023-05-04: qty 30, 30d supply, fill #1
  Filled 2023-06-08: qty 30, 30d supply, fill #2
  Filled 2023-07-09: qty 30, 30d supply, fill #3

## 2023-04-03 NOTE — Telephone Encounter (Signed)
Contacted pt to go over lab results pt is aware and doesn't have any questions or concerns 

## 2023-05-05 ENCOUNTER — Other Ambulatory Visit: Payer: Self-pay

## 2023-05-07 ENCOUNTER — Other Ambulatory Visit: Payer: Self-pay

## 2023-06-09 ENCOUNTER — Other Ambulatory Visit: Payer: Self-pay

## 2023-07-10 ENCOUNTER — Other Ambulatory Visit: Payer: Self-pay

## 2023-08-03 ENCOUNTER — Other Ambulatory Visit: Payer: Self-pay

## 2023-08-03 ENCOUNTER — Encounter (INDEPENDENT_AMBULATORY_CARE_PROVIDER_SITE_OTHER): Payer: Self-pay | Admitting: Primary Care

## 2023-08-03 ENCOUNTER — Ambulatory Visit (INDEPENDENT_AMBULATORY_CARE_PROVIDER_SITE_OTHER): Payer: Self-pay | Admitting: Primary Care

## 2023-08-03 VITALS — BP 129/87 | HR 65 | Resp 16 | Ht 63.0 in | Wt 180.8 lb

## 2023-08-03 DIAGNOSIS — E785 Hyperlipidemia, unspecified: Secondary | ICD-10-CM

## 2023-08-03 DIAGNOSIS — I10 Essential (primary) hypertension: Secondary | ICD-10-CM

## 2023-08-03 DIAGNOSIS — Z23 Encounter for immunization: Secondary | ICD-10-CM

## 2023-08-03 DIAGNOSIS — Z1211 Encounter for screening for malignant neoplasm of colon: Secondary | ICD-10-CM

## 2023-08-03 DIAGNOSIS — K2100 Gastro-esophageal reflux disease with esophagitis, without bleeding: Secondary | ICD-10-CM

## 2023-08-03 MED ORDER — OMEPRAZOLE 20 MG PO CPDR
20.0000 mg | DELAYED_RELEASE_CAPSULE | Freq: Every day | ORAL | 3 refills | Status: DC
  Filled 2023-08-03 – 2023-08-20 (×2): qty 30, 30d supply, fill #0

## 2023-08-03 NOTE — Progress Notes (Signed)
[facility]  [name], is a 47 y.o. male  [id number]  [id number]  DOB - 09-18-1976  Chief Complaint  Patient presents with   Hyperlipidemia       Subjective:   Warren Owens is a 47 y.o. male here today for a follow up visit on hyperlipidemia. He has endorsed taking statins daily and change his diet to decrease fat intake. Also, exercising walking as the weather permits. He does c/o of pain in the middle of his chest non radiating factors. -stop eating spicy and greasy foods that has helped some. Drinks 3 cups of caffeine- agreed to 2 cups for the next 2 weeks than 1 cup daily . Avoid laying down after eating 71mins-1hour, elevated head of the bed. Patient has No headache, No chest pain, No abdominal pain - No Nausea, No new weakness tingling or numbness, No Cough - shortness of breath  No problems updated.  Allergies  Allergen Reactions   Shellfish Allergy Anaphylaxis    Past Medical History:  Diagnosis Date   Hyperlipidemia     Current Outpatient Medications on File Prior to Visit  Medication Sig Dispense Refill   albuterol (PROVENTIL HFA;VENTOLIN HFA) 108 (90 BASE) MCG/ACT inhaler Inhale into the lungs every 6 (six) hours as needed for wheezing or shortness of breath.     ibuprofen (ADVIL) 600 MG tablet Take 1 tablet (600 mg total) by mouth every 8 (eight) hours as needed. 60 tablet 0   pravastatin (PRAVACHOL) 80 MG tablet Take 1 tablet (80 mg total) by mouth daily. 90 tablet 1   omeprazole (PRILOSEC) 20 MG capsule Take 1 capsule (20 mg total) by mouth daily. (Patient not taking: Reported on 11/06/2021) 30 capsule 3   sucralfate (CARAFATE) 1 g tablet Take 1 tablet (1 g total) by mouth 4 (four) times daily -  with meals and at bedtime. (Patient not taking: Reported on 08/03/2023) 120 tablet 0   No current facility-administered medications on file prior to visit.    Objective:   Vitals:   08/03/23 0925 08/03/23 0928   BP: 131/87 129/87  Pulse: 65   Resp: 16   SpO2: 98%   Weight: 180 lb 12.8 oz (82 kg)   Height: 5\' 3"  (1.6 m)     Comprehensive ROS Pertinent positive and negative noted in HPI   Exam General appearance : Awake, alert, not in any distress. Speech Clear. Not toxic looking HEENT: Atraumatic and Normocephalic, pupils equally reactive to light and accomodation Neck: Supple, no JVD. No cervical lymphadenopathy.  Chest: Good air entry bilaterally, no added sounds  CVS: S1 S2 regular, no murmurs.  Abdomen: Bowel sounds present, Non tender and not distended with no gaurding, rigidity or rebound. Extremities: B/L Lower Ext shows no edema, both legs are warm to touch Neurology: Awake alert, and oriented X 3, CN II-XII intact, Non focal Skin: No Rash  Data Review Lab Results  Component Value Date   HGBA1C 5.6 03/31/2023   HGBA1C 5.7 (A) 11/06/2021   HGBA1C 5.3 01/04/2019    Assessment & Plan  Thielen was seen today for hyperlipidemia.  Diagnoses and all orders for this visit:  Encounter for immunization -     Flu vaccine trivalent PF, 6mos and older(Flulaval,Afluria,Fluarix,Fluzone)  Gastroesophageal reflux disease with esophagitis without hemorrhage -     omeprazole (PRILOSEC) 20 MG capsule; Take 1 capsule (20 mg total) by mouth daily.  Colon cancer screening -     Fecal occult blood, imunochemical  Class 2  severe obesity due to excess calories with serious comorbidity in adult, unspecified BMI (HCC)  Elevated blood pressure reading in office with diagnosis of hypertension     Patient have been counseled extensively about nutrition and exercise. Other issues discussed during this visit include: low cholesterol diet, weight control and daily exercise, foot care, annual eye examinations at Ophthalmology, importance of adherence with medications and regular follow-up. We also discussed long term complications of uncontrolled diabetes and hypertension.   No follow-ups on  file.  The patient was given clear instructions to go to ER or return to medical center if symptoms don't improve, worsen or new problems develop. The patient verbalized understanding. The patient was told to call to get lab results if they haven't heard anything in the next week.   This note has been created with Education officer, environmental. Any transcriptional errors are unintentional.   [name], NP 08/03/2023, 9:34 AM

## 2023-08-03 NOTE — Patient Instructions (Addendum)
Recuento de caloras para bajar de peso Calorie Counting for [employer] Loss Las caloras son unidades de Engineer, drilling. El cuerpo necesita una cierta cantidad de caloras de los alimentos para que lo ayuden a funcionar durante todo Medical laboratory scientific officer. Cuando se comen o beben ms caloras de las que el cuerpo [place], este acumula las caloras adicionales mayormente como grasa. Cuando se comen o beben menos caloras de las que el cuerpo [place], este quema grasa para obtener la energa que necesita. El recuento de caloras es el registro de la cantidad de caloras que se comen y Audiological scientist. El recuento de caloras puede ser de ayuda si necesita perder peso. Si come menos caloras de las que el cuerpo necesita, debera bajar de [place]. Pregntele al mdico cul es un peso sano para usted. Para que el recuento de caloras funcione, usted tendr que ingerir la cantidad de caloras adecuadas cada da, para bajar una cantidad de peso saludable por semana. Un nutricionista puede ayudar a determinar la cantidad de caloras que usted necesita por da y sugerirle formas de Barista su objetivo calrico. Neomia Dear cantidad de peso saludable para bajar cada semana suele ser entre 1 y 2 libras (0.5 a 0.9 kg). Esto habitualmente significa que su ingesta diaria de caloras se debera reducir en unas 500 a 750 caloras. Ingerir de 1200 a 1500 caloras por Clinical research associate a la [employer] de las mujeres a Publishing copy de [place]. Ingerir de 1500 a 1800 caloras por Clinical research associate a la [employer] de los hombres a Publishing copy de [place]. Qu debo saber acerca del recuento de caloras? Trabaje con el mdico o el nutricionista para determinar cuntas caloras debe recibir Management consultant. A fin de alcanzar su objetivo diario de caloras, tendr que: Averiguar cuntas caloras hay en cada alimento que le Lobbyist. Intente hacerlo antes de comer. Decidir la cantidad que puede comer del alimento. Llevar un registro de los alimentos. Para esto, anote lo que comi y cuntas  caloras tena. Para perder peso con xito, es importante equilibrar el recuento de caloras con un estilo de vida saludable que incluya actividad fsica de forma regular. Dnde encuentro informacin sobre las caloras?  Es posible Veterinary surgeon cantidad de caloras que contiene un alimento en la etiqueta de informacin nutricional. Si un alimento no tiene una etiqueta de informacin nutricional, intente buscar las caloras en Internet o pida ayuda al nutricionista. Recuerde que las caloras se calculan por porcin. Si opta por comer ms de una porcin de un alimento, tendr [employer] las caloras de una porcin por la cantidad de porciones que planea comer. Por ejemplo, la etiqueta de un envase de pan puede decir que el tamao de una porcin es 1 rodaja, y que una porcin tiene 90 caloras. Si come 1 rodaja, habr comido 90 caloras. Si come 2 rodajas, habr comido 180 caloras. Cmo llevo un registro de comidas? Despus de cada vez que coma, anote lo siguiente en el registro de alimentos lo antes posible: Lo que comi. Asegrese de [employer], las salsas y otros extras [employer]. La cantidad que comi. Esto se puede medir en tazas, onzas o cantidad de alimentos. Cuntas caloras haba en cada alimento y en cada bebida. La cantidad total de caloras en la comida que tom. Tenga a Scientific laboratory technician de alimentos, por ejemplo, en un anotador de bolsillo o utilice una aplicacin o sitio web en el telfono mvil. Algunos programas calcularn las caloras por usted y Insurance account manager la cantidad de  caloras que le quedan para llegar al objetivo diario. Cules son algunos consejos para controlar las porciones? Sepa cuntas caloras hay en una porcin. Esto lo ayudar a saber cuntas porciones de un alimento determinado puede comer. Use una taza medidora para medir los tamaos de las porciones. Tambin Hydrographic surveyor las porciones en una balanza de cocina. Con el tiempo, podr hacer  un clculo estimativo de los tamaos de las porciones de algunos alimentos. Dedique tiempo a poner porciones de diferentes alimentos en sus platos, tazones y tazas predilectos, a fin de saber cmo se ve una porcin. Intente no comer directamente de un envase de alimentos, por ejemplo, de una bolsa o una caja. Comer directamente del envase dificulta ver cunto est comiendo y puede conducir a Actuary. Ponga la cantidad [employer] gustara comer en una taza o un plato, a fin de asegurarse de que est comiendo la porcin correcta. Use platos, vasos y tazones ms pequeos para medir porciones ms pequeas y Automotive engineer no comer en exceso. Intente no realizar varias tareas al [employer]. Por ejemplo, evite mirar televisin o usar la [employer]ant come. Si es la hora de comer, sintese a Museum/gallery conservator y disfrute de Chemical engineer. Esto lo ayudar a Public house manager cundo est satisfecho. Tambin le permitir estar ms consciente de qu come y cunto come. Consejos para seguir Surveyor, minerals Al leer las etiquetas de los alimentos Controle el recuento de caloras en comparacin con el tamao de la porcin. El tamao de la porcin puede ser ms pequeo de lo que suele comer. Verifique la fuente de las caloras. Intente elegir alimentos ricos en protenas, fibras y vitaminas, y bajos en grasas saturadas, grasas trans y [place]. Al ir de compras Lea las etiquetas nutricionales cuando compre. Esto lo ayudar a tomar decisiones saludables sobre qu alimentos comprar. Preste atencin a las etiquetas nutricionales de alimentos bajos en grasas o sin grasas. Estos alimentos a veces tienen la misma cantidad de caloras o ms caloras que las versiones ricas en grasas. Con frecuencia, tambin tienen agregados de azcar, almidn o sal, para darles el sabor que fue eliminado con las grasas. Haga una lista de compras con los alimentos que tienen un menor contenido de caloras y Leisure centre manager. Al cocinar Intente cocinar sus alimentos preferidos  de una manera ms saludable. Por ejemplo, pruebe hornear en vez de frer. Utilice productos lcteos descremados. Planificacin de las comidas Utilice ms frutas y verduras. La mitad de su plato debe ser de frutas y verduras. Incluya protenas magras, como pollo, pavo y [place]. Estilo de [employer], trate de hacer una de las siguientes cosas: 150 minutos de ejercicio moderado, como caminar. 75 minutos de ejercicio enrgico, como correr. Informacin general Sepa cuntas caloras tienen los alimentos que come con ms frecuencia. Esto le ayudar a contar las caloras ms rpidamente. Encuentre un mtodo para controlar las caloras que funcione para usted. Sea creativo. Pruebe aplicaciones o programas distintos, si llevar un registro de las caloras no funciona para usted. Qu alimentos debo consumir?  Consuma alimentos nutritivos. Es mejor comer un alimento nutritivo, de alto contenido calrico, como un aguacate, que uno con pocos nutrientes, como una bolsa de patatas fritas. Use sus caloras en alimentos y bebidas que lo sacien y no lo dejen con apetito apenas termina de comer. Ejemplos de alimentos que lo sacian son los frutos secos y Civil engineer, contracting de frutos secos, verduras, Associate Professor y Forensic scientist con alto contenido de Research scientist (life sciences) como los cereales integrales. Los alimentos con [employer] Financial  contenido de [place] son aquellos que tienen ms de 5 g de fibra por porcin. Preste atencin a las [employer] Brands. Las bebidas de bajas caloras incluyen agua y refrescos sin International aid/development worker. Es posible que los productos que se enumeran ms [place] no constituyan una lista completa de los alimentos y las bebidas que puede tomar. Consulte a un nutricionista para obtener ms informacin. Qu alimentos debo limitar? Limite el consumo de alimentos o bebidas que no sean buenas fuentes de vitaminas, minerales o protenas, o que tengan alto contenido de grasas no saludables. Estos incluyen: Caramelos. Otros  dulces. Refrescos, bebidas con caf especiales, alcohol y [place]. Es posible que los productos que se enumeran ms [place] no constituyan una lista completa de los alimentos y las bebidas que Personnel officer. Consulte a un nutricionista para obtener ms informacin. Cmo puedo hacer el recuento de caloras cuando como afuera? Preste atencin a las porciones. A menudo, las porciones son mucho ms grandes al comer afuera. Pruebe con estos consejos para mantener las porciones ms pequeas: Considere la posibilidad de compartir una comida en lugar de tomarla toda usted solo. Si pide su propia comida, coma solo la mitad. Antes de empezar a comer, pida un recipiente y ponga la mitad de la comida en l. Cuando sea posible, considere la posibilidad de pedir porciones ms pequeas del men en lugar de porciones completas. Preste atencin a Soil scientist de alimentos y bebidas. Saber la forma en que se cocinan los alimentos y lo que incluye la comida puede ayudarlo a ingerir menos caloras. Si se detallan las caloras en el men, elija las opciones que contengan la menor cantidad. Elija platos que incluyan verduras, frutas, cereales integrales, productos lcteos con bajo contenido de grasa y Associate Professor. Opte por los alimentos hervidos, asados, cocidos a la parrilla o al vapor. Evite los alimentos a los que se les ponga mantequilla, que estn empanados o fritos, o que se sirvan con salsa a base de crema. Generalmente, los alimentos que se etiquetan como "crujientes" estn fritos, a menos que se indique lo contrario. Elija el agua, la [place], [place] t helado sin azcar u otras bebidas que no contengan azcares agregados. Si desea una bebida alcohlica, escoja una opcin con menos caloras, como una copa de vino o una cerveza ligera. Ordene los [employer], las salsas y los jarabes aparte. Estos son, con frecuencia, de alto contenido en caloras, por lo que debe limitar la cantidad que ingiere. Si desea [place], elija una de hortalizas y pida carnes a la parrilla. Evite las guarniciones adicionales como el tocino, el queso o los alimentos fritos. Ordene el aderezo aparte o pida aceite de [place] y vinagre o limn para Emergency planning/management officer. Haga un clculo estimativo de la cantidad de porciones que le sirven. Conocer el tamao de las porciones lo ayudar a Theme park manager atento a la cantidad de comida que come [employer]. Dnde buscar ms informacin Centers for Disease Control and Prevention (Centros para el Control y la Prevencin de Enfermedades): [email or web address] U.S. Department of Agriculture (Departamento de Agricultura de los EE. UU.): [email or web address] Resumen El recuento de caloras es el registro de la cantidad de caloras que se comen y Audiological scientist. Si come menos caloras de las que el cuerpo necesita, debera bajar de [place]. Una cantidad de peso saludable para bajar por semana suele ser entre 1 y 2 libras (0.5 a 0.9 kg). Esto significa, con frecuencia, reducir su ingesta diaria de caloras unas 500 a 750 caloras. Es posible  encontrar la cantidad de caloras que contiene un alimento en la etiqueta de informacin nutricional. Si un alimento no tiene una etiqueta de informacin nutricional, intente buscar las caloras en Internet o pida ayuda al nutricionista. Use platos, vasos y tazones ms pequeos para medir porciones ms pequeas y Automotive engineer no comer en exceso. Use sus caloras en alimentos y bebidas que lo sacien y no lo dejen con apetito poco tiempo despus de haber comido. Esta informacin no tiene Theme park manager el consejo del mdico. Asegrese de hacerle al mdico cualquier pregunta que tenga. Document Revised: 02/20/2020 Document Reviewed: 02/20/2020 Elsevier Patient Education  2023 Elsevier Inc. Prevencin de la hipertensin Preventing Hypertension La hipertensin, tambin conocida como presin arterial alta, se produce cuando la sangre bombea en las arterias con demasiada fuerza. Las arterias son  vasos sanguneos que transportan la sangre desde el corazn al resto del cuerpo. Con frecuencia, la hipertensin no causa sntomas hasta que la presin arterial es muy alta. Es importante que controle regularmente su presin arterial. Los cambios en la dieta y el estilo de vida pueden ayudar a prevenir la hipertensin y a Estate agent mejor al mejorar su calidad de vida. Si ya tiene hipertensin, puede controlarla con cambios en la dieta y el estilo de vida y con medicamentos. Cmo puede afectarme esta enfermedad? Con el transcurso del [place], la hipertensin puede daar las arterias y Engineer, manufacturing systems flujo de sangre hacia partes importantes del cuerpo que incluyen el cerebro, el corazn y los riones. Si mantiene su presin arterial en un nivel saludable, podr prevenir complicaciones como un infarto de miocardio, insuficiencia cardaca, un accidente cerebrovascular, insuficiencia renal y demencia vascular. Qu puede aumentar el riesgo? Una alimentacin poco saludable y la falta de actividad fsica pueden aumentar las probabilidades de tener presin arterial alta. Algunos otros factores de riesgo son los siguientes: Edad. El riesgo aumenta con la edad. Tener familiares que han tenido presin arterial alta. Tener ciertas afecciones, como problemas de tiroides. Tener sobrepeso u obesidad. Consumir cafena o alcohol en exceso. Consumir mucha grasa, azcar, caloras o sal (sodio) en su dieta. Fumar o consumir drogas ilegales. Tomar ciertos medicamentos, como antidepresivos, descongestivos, pldoras anticonceptivas y antiinflamatorios no esteroideos (AINE), como el ibuprofeno. Qu medidas puedo tomar para prevenir o controlar esta afeccin? Trabaje junto al mdico para desarrollar un plan de prevencin de la hipertensin que funcione para usted. Es posible que lo deriven para que reciba asesoramiento sobre una dieta saludable y Doroteo Glassman fsica. Siga su plan y Joelyn Oms a todas las visitas de  seguimiento. Cambios en la dieta Siga una dieta saludable. Esto puede comprender lo siguiente: Menor ingesta de sal (sodio). Pregntele al mdico cunto sodio puede consumir de forma segura. La recomendacin general es consumir menos de 1 cucharadita (2300 mg) de sodio por da. No agregue sal a las comidas. Opte por alimentos con bajo contenido de sodio cuando realice las compras o coma fuera de casa. Limite la cantidad de grasa en la dieta. Esto se puede lograr con Enterprise Products o de bajo contenido de grasas e ingiriendo menor cantidad de carnes rojas. Coma ms frutas, verduras y cereales integrales. Establezca un objetivo para comer: 1 a 2 tazas de frutas y verduras frescas todos los [address]  [address]. 3 a 4 porciones de cereales [employer]. Evite los alimentos y las bebidas que tengan azcares agregados. Coma pescados que contengan grasas saludables (cidos grasos omega-3), como la caballa o el salmn. Si necesita implementar un plan de comidas saludable, pruebe la dieta DASH.  Esta dieta tiene un alto contenido de frutas, verduras y Radiation protection practitioner. Incluye poca cantidad de sodio, carnes rojas y azcares agregados. DASH es la sigla en ingls de "Enfoques Alimentarios para Detener la Hipertensin". Cambios en el estilo de vida  Baje de peso si es necesario. Con tan solo bajar entre el 3 % y el 5 % del peso corporal, puede prevenir o Chief Operating Officer la hipertensin. Por ejemplo, si su peso actual es de 200 libras (91 kg), una prdida entre el 3 % y el 5 % de su peso significa perder entre 6 y 10 libras (2.7 a 4.5 kg). Pdale al mdico que le recomiende una dieta y un plan de ejercicios para bajar de peso de forma segura. Ejerctate lo suficiente. Debe realizar al menos 150 minutos de ejercicios de intensidad moderada todas las semanas. Puede realizar [employer] en sesiones cortas de ejercicios, varias veces al da, o puede realizar sesiones ms largas, pero menos veces por semana. Por  ejemplo, puede realizar una caminata enrgica o andar en bicicleta durante 10 minutos, 3 veces al da, durante 5 das a la [place]. Encuentre maneras de reducir el estrs, como hacer ejercicios, Primary school teacher, Optometrist o tomar una clase de yoga. Si necesita ayuda para reducir [employer] de estrs, consulte al mdico. No consuma ningn producto que contenga nicotina o tabaco. Estos productos incluyen cigarrillos, tabaco para Theatre manager y aparatos de vapeo, como los Administrator, Civil Service. Las sustancias qumicas presentes en los productos con tabaco y nicotina elevan su presin arterial cada vez que los consume. Si necesita ayuda para dejar de consumir estos productos, consulte al mdico. Aprenda a medir su presin arterial en casa. Asegrese de Solicitor su objetivo de presin arterial, como se lo haya indicado el mdico. Trate de dormir entre 7 y 9 horas todas las noches. Consumo de alcohol No beba alcohol si: Su mdico le indica no hacerlo. Est embarazada, puede estar embarazada o est tratando de [place]. Si bebe alcohol: Limite la cantidad que bebe a lo siguiente: De 0 a 1 medida por da para las mujeres. De 0 a 2 medidas por da para los hombres. Sepa cunta cantidad de alcohol hay en las bebidas que toma. En los [address], una medida equivale a una botella de cerveza de 12 oz (355 ml), un vaso de vino de 5 oz (148 ml) o un vaso de una bebida alcohlica de alta graduacin de 1 oz (44 ml). Medicamentos Adems de los cambios en la dieta y el estilo de vida, [place] mdico podr indicarle medicamentos para ayudarle a Publishing copy su presin arterial. En general: Tal vez deba probar distintos medicamentos hasta encontrar el ms adecuado para usted. Quiz necesite tomar ms de un medicamento. Use los medicamentos de venta libre y los recetados solamente como se lo haya indicado el mdico. Preguntas para hacerle al mdico Cul es mi presin arterial ideal? Cmo disminuyo mi riesgo de tener  presin arterial alta? Cmo debo controlar mi presin arterial en casa? Dnde obtener apoyo Su mdico puede ayudarle a prevenir la hipertensin y Pharmacologist su presin arterial en un nivel saludable. Su hospital o comunidad locales tambin pueden proporcionarle servicios y programas de prevencin. La American Heart Association (Asociacin Estadounidense del Corazn) ofrece un red de ayuda en lnea en [email or web address] Dnde obtener ms informacin Obtenga ms informacin sobre la hipertensin en: Armed forces training and education officer, Lung, and Blood Institute (Instituto Nacional del [place], los Pulmones y Risk manager): [email or web address] Centers for Disease Control and Prevention (Centros para Air traffic controller y Engineer, agricultural  Prevencin de [employer]): [email or web address] American Academy of Family Physicians (Academia Estadounidense de Mdicos de [place]): familydoctor.org Obtenga ms informacin sobre la dieta DASH en: [employer]'s, Lung, and Blood Institute (Instituto [employer] del [place], los Pulmones y Risk manager): [email or web address] Comunquese con un mdico si: Piensa que tiene una reaccin alrgica a los medicamentos que ha tomado. Tiene mareos o dolores de cabeza con Naval architect. Tiene hinchazn en los tobillos. Tiene problemas de visin. Solicite ayuda de inmediato si: Tiene un dolor o Dentist repentino o intenso en el pecho, la espalda o el abdomen. Le falta el aire. Tienes un dolor de cabeza repentino e intenso. Estos sntomas pueden Customer service manager. Solicite ayuda de inmediato. Llame al 911. No espere a ver si los sntomas desaparecen. No conduzca por sus propios medios [employer] hospital. Resumen La hipertensin con frecuencia no provoca sntomas hasta que la presin arterial es muy alta. Es importante que controle regularmente su presin arterial. Los cambios en la dieta y el estilo de vida son pasos importantes para prevenir la hipertensin. Si mantiene su presin arterial en un nivel saludable, podr  prevenir complicaciones como un infarto de miocardio, insuficiencia cardaca, un accidente cerebrovascular e insuficiencia renal. Trabaje junto al mdico para desarrollar un plan de prevencin de la hipertensin que funcione para usted. Esta informacin no tiene Theme park manager el consejo del mdico. Asegrese de hacerle al mdico cualquier pregunta que tenga. Document Revised: 09/11/2021 Document Reviewed: 09/11/2021 Elsevier Patient Education  2024 Elsevier Inc. Influenza Vaccine Injection What is this medication? INFLUENZA VACCINE (in floo EN zuh vak SEEN) reduces the risk of the influenza (flu). It does not treat influenza. It is still possible to get influenza after receiving this vaccine, but the symptoms may be less severe or not last as long. It works by helping your immune system learn how to fight off a future infection. This medicine may be used for other purposes; ask your health care provider or pharmacist if you have questions. COMMON BRAND NAME(S): Afluria Quadrivalent, FLUAD Quadrivalent, Fluarix Quadrivalent, Flublok Quadrivalent, FLUCELVAX Quadrivalent, Flulaval Quadrivalent, Fluzone Quadrivalent What should I tell my care team before I take this medication? They need to know if you have any of these conditions: Bleeding disorder like hemophilia Fever or infection Guillain-Barre syndrome or other neurological problems Immune system problems Infection with the human immunodeficiency virus (HIV) or AIDS Low blood platelet counts Multiple sclerosis An unusual or allergic reaction to influenza virus vaccine, latex, other medications, foods, dyes, or preservatives. Different brands of vaccines contain different allergens. Some may contain latex or eggs. Talk to your care team about your allergies to make sure that you get the right vaccine. Pregnant or trying to get pregnant Breastfeeding How should I use this medication? This vaccine is injected into a muscle or under the  skin. It is given by your care team. A copy of Vaccine Information Statements will be given before each vaccination. Be sure to read this sheet carefully each time. This sheet may change often. Talk to your care team to see which vaccines are right for you. Some vaccines should not be used in all age groups. Overdosage: If you think you have taken too much of this medicine contact a poison control center or emergency room at once. NOTE: This medicine is only for you. Do not share this medicine with others. What if I miss a dose? This does not apply. What may interact with this medication? Certain medications that lower your immune system, such as etanercept, anakinra, infliximab,  adalimumab Certain medications that prevent or treat blood clots, such as warfarin Chemotherapy or radiation therapy Phenytoin Steroid medications, such as prednisone or cortisone Theophylline Vaccines This list may not describe all possible interactions. Give your health care provider a list of all the medicines, herbs, non-prescription drugs, or dietary supplements you use. Also tell them if you smoke, drink alcohol, or use illegal drugs. Some items may interact with your medicine. What should I watch for while using this medication? Report any side effects that do not go away with your care team. Call your care team if any unusual symptoms occur within 6 weeks of receiving this vaccine. You may still catch the flu, but the illness is not usually as bad. You cannot get the flu from the vaccine. The vaccine will not protect against colds or other illnesses that may cause fever. The vaccine is needed every year. What side effects may I notice from receiving this medication? Side effects that you should report to your care team as soon as possible: Allergic reactions--skin rash, itching, hives, swelling of the face, lips, tongue, or throat Side effects that usually do not require medical attention (report these to your  care team if they continue or are bothersome): Chills Fatigue Headache Joint pain Loss of appetite Muscle pain Nausea Pain, redness, or irritation at injection site This list may not describe all possible side effects. Call your doctor for medical advice about side effects. You may report side effects to FDA at 1-800-FDA-1088. Where should I keep my medication? The vaccine is only given by your care team. It will not be stored at home. NOTE: This sheet is a summary. It may not cover all possible information. If you have questions about this medicine, talk to your doctor, pharmacist, or health care provider.  2024 Elsevier/Gold Standard (2022-04-08 00:00:00)

## 2023-08-04 LAB — LIPID PANEL
Chol/HDL Ratio: 3 ratio (ref 0.0–5.0)
Cholesterol, Total: 134 mg/dL (ref 100–199)
HDL: 45 mg/dL (ref >39)
LDL Chol Calc (NIH): 68 mg/dL (ref 0–99)
Triglycerides: 114 mg/dL (ref 0–149)
VLDL Cholesterol Cal: 21 mg/dL (ref 5–40)

## 2023-08-05 ENCOUNTER — Other Ambulatory Visit: Payer: Self-pay

## 2023-08-05 ENCOUNTER — Other Ambulatory Visit (INDEPENDENT_AMBULATORY_CARE_PROVIDER_SITE_OTHER): Payer: Self-pay | Admitting: Primary Care

## 2023-08-05 MED ORDER — PRAVASTATIN SODIUM 20 MG PO TABS
20.0000 mg | ORAL_TABLET | Freq: Every day | ORAL | 1 refills | Status: DC
  Filled 2023-08-05: qty 30, 30d supply, fill #0
  Filled 2023-08-20: qty 90, 90d supply, fill #0
  Filled 2023-11-19 – 2023-11-20 (×2): qty 90, 90d supply, fill #1

## 2023-08-06 LAB — FECAL OCCULT BLOOD, IMMUNOCHEMICAL: Fecal Occult Bld: NEGATIVE

## 2023-08-07 ENCOUNTER — Telehealth (INDEPENDENT_AMBULATORY_CARE_PROVIDER_SITE_OTHER): Payer: Self-pay

## 2023-08-07 NOTE — Telephone Encounter (Signed)
Contacted pt to go over lab results pt is aware and doesn't have any questions or concerns 

## 2023-08-14 ENCOUNTER — Other Ambulatory Visit: Payer: Self-pay

## 2023-08-20 ENCOUNTER — Other Ambulatory Visit: Payer: Self-pay

## 2023-11-02 ENCOUNTER — Ambulatory Visit (INDEPENDENT_AMBULATORY_CARE_PROVIDER_SITE_OTHER): Payer: Self-pay | Admitting: Primary Care

## 2023-11-20 ENCOUNTER — Other Ambulatory Visit: Payer: Self-pay

## 2024-04-15 ENCOUNTER — Ambulatory Visit: Payer: Self-pay

## 2024-04-15 NOTE — Telephone Encounter (Signed)
 FYI

## 2024-04-15 NOTE — Telephone Encounter (Signed)
 FYI Only or Action Required?: FYI only for provider  Patient was last seen in primary care on 08/03/2023 by Marius Siemens, NP. Called Nurse Triage reporting Chest Pain and Abdominal Pain. Symptoms began several months ago. Interventions attempted: Dietary changes. Symptoms are: gradually worsening.  Triage Disposition: See Physician Within 24 Hours  Patient/caregiver understands and will follow disposition?:                         Copied from CRM [id number]. Topic: Clinical - Red Word Triage >> Apr 15, 2024  3:03 PM Carla L wrote: Red Word that prompted transfer to Nurse Triage: pt having abdominal pain and chest pain   Interpreter Antonio ID: [id number] Reason for Disposition  [1] MODERATE pain (e.g., interferes with normal activities) AND [2] pain comes and goes (cramps) AND [3] present > 24 hours  (Exception: Pain with Vomiting or Diarrhea - see that Guideline.)  Answer Assessment - Initial Assessment Questions 1. LOCATION: "Where does it hurt?"       Epigastric.  2. RADIATION: "Does the pain go anywhere else?" (e.g., into neck, jaw, arms, back)     Chest left and right side, sometimes to back.  3. ONSET: "When did the chest pain begin?" (Minutes, hours or days)      2-3 months. It started once or twice a month but has started to become almost every day.  4. PATTERN: "Does the pain come and go, or has it been constant since it started?"  "Does it get worse with exertion?"      Comes and goes.  5. DURATION: "How long does it last" (e.g., seconds, minutes, hours)     1-2 seconds.  6. SEVERITY: "How bad is the pain?"  (e.g., Scale 1-10; mild, moderate, or severe)    - MILD (1-3): doesn't interfere with normal activities     - MODERATE (4-7): interferes with normal activities or awakens from sleep    - SEVERE (8-10): excruciating pain, unable to do any normal activities       5/10.  7. CARDIAC RISK FACTORS: "Do you have any history of heart problems or risk  factors for heart disease?" (e.g., angina, prior heart attack; diabetes, high blood pressure, high cholesterol, smoker, or strong family history of heart disease)     No.  8. PULMONARY RISK FACTORS: "Do you have any history of lung disease?"  (e.g., blood clots in lung, asthma, emphysema, birth control pills)     No.  9. CAUSE: "What do you think is causing the chest pain?"     He states after he eats he feel the pain worsens but doesn't think it is his reflux. He states he changed his diet (stopped eating greasy, spicy fat foods and coffee).  10. OTHER SYMPTOMS: "Do you have any other symptoms?" (e.g., dizziness, nausea, vomiting, sweating, fever, difficulty breathing, cough)       He states sometimes his stomach feels full  11. PREGNANCY: "Is there any chance you are pregnant?" "When was your last menstrual period?"       N/A.  Patient states he had tried Nexium and Miralax in the past. He states he has not recently taken. Patient denies nausea, vomiting, diarrhea, fever, difficulty breathing, cough, sweating, dizziness.Spanish interpreter dropped off when warm transferred by agent, patient states he understands [place] and does not wish to have a [place] interpreter (offered to call back with Spanish interpreter). Patient insisting on waiting til June 23rd to see  PCP, RN advised urgent care. Called [facility] and confirmed patient not able to schedule an acute visit with PCP.  Protocols used: Chest Pain-A-AH, Abdominal Pain - Male-A-AH

## 2024-04-18 NOTE — Telephone Encounter (Signed)
 Noted thank you

## 2024-05-19 ENCOUNTER — Telehealth (INDEPENDENT_AMBULATORY_CARE_PROVIDER_SITE_OTHER): Payer: Self-pay | Admitting: Primary Care

## 2024-05-19 NOTE — Telephone Encounter (Signed)
 Called pt to confirm appt. Pt was able to reschedule appt for a later time.

## 2024-05-20 ENCOUNTER — Other Ambulatory Visit: Payer: Self-pay

## 2024-05-20 ENCOUNTER — Ambulatory Visit (INDEPENDENT_AMBULATORY_CARE_PROVIDER_SITE_OTHER): Payer: Self-pay | Admitting: Primary Care

## 2024-05-20 ENCOUNTER — Encounter (INDEPENDENT_AMBULATORY_CARE_PROVIDER_SITE_OTHER): Payer: Self-pay | Admitting: Primary Care

## 2024-05-20 VITALS — BP 125/83 | HR 74 | Resp 16 | Ht 63.0 in | Wt 185.0 lb

## 2024-05-20 DIAGNOSIS — E785 Hyperlipidemia, unspecified: Secondary | ICD-10-CM

## 2024-05-20 DIAGNOSIS — Z23 Encounter for immunization: Secondary | ICD-10-CM

## 2024-05-20 DIAGNOSIS — K2100 Gastro-esophageal reflux disease with esophagitis, without bleeding: Secondary | ICD-10-CM

## 2024-05-20 DIAGNOSIS — R1084 Generalized abdominal pain: Secondary | ICD-10-CM

## 2024-05-20 DIAGNOSIS — R7303 Prediabetes: Secondary | ICD-10-CM

## 2024-05-20 DIAGNOSIS — K5909 Other constipation: Secondary | ICD-10-CM

## 2024-05-20 DIAGNOSIS — E66812 Obesity, class 2: Secondary | ICD-10-CM

## 2024-05-20 DIAGNOSIS — Z1211 Encounter for screening for malignant neoplasm of colon: Secondary | ICD-10-CM

## 2024-05-20 DIAGNOSIS — Z0000 Encounter for general adult medical examination without abnormal findings: Secondary | ICD-10-CM

## 2024-05-20 DIAGNOSIS — Z6832 Body mass index (BMI) 32.0-32.9, adult: Secondary | ICD-10-CM

## 2024-05-20 MED ORDER — OMEPRAZOLE 20 MG PO CPDR
20.0000 mg | DELAYED_RELEASE_CAPSULE | Freq: Every day | ORAL | 3 refills | Status: AC
  Filled 2024-05-20: qty 30, 30d supply, fill #0

## 2024-05-20 MED ORDER — SENNA-DOCUSATE SODIUM 8.6-50 MG PO TABS
1.0000 | ORAL_TABLET | Freq: Every day | ORAL | 1 refills | Status: AC
  Filled 2024-05-20: qty 90, 90d supply, fill #0

## 2024-05-20 NOTE — Progress Notes (Unsigned)
 [facility]  Warren Owens is a 48 y.o. male presents to office today for annual physical exam examination.    Concerns today include: 1. Abdominal pain   Occupation: Optician, dispensing, Marital status: M, Substance use: No Diet: monitoring intake , Exercise: yes  Health Maintenance  Topic Date Due   Hepatitis B Vaccines (1 of 3 - 19+ 3-dose series) Never done   Colonoscopy  Never done   COVID-19 Vaccine (3 - 2024-25 season) 07/12/2023   INFLUENZA VACCINE  06/10/2024   LIPID PANEL  08/02/2024   COLON CANCER SCREENING ANNUAL FOBT  08/03/2024   DTaP/Tdap/Td (4 - Td or Tdap) 09/10/2031   Hepatitis C Screening  Completed   HIV Screening  Completed   HPV VACCINES  Aged Out   Meningococcal B Vaccine  Aged Out    Past Medical History:  Diagnosis Date   Hyperlipidemia    Social History   Socioeconomic History   Marital status: Married    Spouse name: Not on file   Number of children: Not on file   Years of education: Not on file   Highest education level: Not on file  Occupational History   Not on file  Tobacco Use   Smoking status: Never   Smokeless tobacco: Never  Vaping Use   Vaping status: Never Used  Substance and Sexual Activity   Alcohol use: No   Drug use: No   Sexual activity: Yes  Other Topics Concern   Not on file  Social History Narrative   Not on file   Social Drivers of Health   Financial Resource Strain: Low Risk  (08/03/2023)   Overall Financial Resource Strain (CARDIA)    Difficulty of Paying Living Expenses: Not hard at all  Food Insecurity: No Food Insecurity (05/20/2024)   Hunger Vital Sign    Worried About Running Out of Food in the Last Year: Never true    Ran Out of Food in the Last Year: Never true  Transportation Needs: No Transportation Needs (05/20/2024)   PRAPARE - Administrator, Civil Service (Medical): No    Lack of Transportation (Non-Medical): No  Physical Activity: Sufficiently Active (08/03/2023)    Exercise Vital Sign    Days of Exercise per Week: 7 days    Minutes of Exercise per Session: 30 min  Stress: No Stress Concern Present (08/03/2023)   [employer] of Occupational Health - Occupational Stress Questionnaire    Feeling of Stress : Not at all  Social Connections: Socially Integrated (08/03/2023)   Social Connection and Isolation Panel    Frequency of Communication with Friends and Family: More than three times a week    Frequency of Social Gatherings with Friends and Family: More than three times a week    Attends Religious Services: More than 4 times per year    Active Member of [employer] or Organizations: No    Attends Banker Meetings: 1 to 4 times per year    Marital Status: Married  Catering manager Violence: Not At Risk (05/20/2024)   Humiliation, Afraid, Rape, and Kick questionnaire    Fear of Current or Ex-Partner: No    Emotionally Abused: No    Physically Abused: No    Sexually Abused: No   No past surgical history on file. Family History  Problem Relation Age of Onset   Hypertension Mother    Diabetes Father     Current Outpatient Medications:    albuterol (PROVENTIL HFA;VENTOLIN HFA) 108 (90  BASE) MCG/ACT inhaler, Inhale into the lungs every 6 (six) hours as needed for wheezing or shortness of breath., Disp: , Rfl:    ibuprofen  (ADVIL ) 600 MG tablet, Take 1 tablet (600 mg total) by mouth every 8 (eight) hours as needed., Disp: 60 tablet, Rfl: 0   omeprazole  (PRILOSEC) 20 MG capsule, Take 1 capsule (20 mg total) by mouth daily., Disp: 30 capsule, Rfl: 3   pravastatin  (PRAVACHOL ) 20 MG tablet, Take 1 tablet (20 mg total) by mouth daily., Disp: 90 tablet, Rfl: 1 Outpatient Encounter Medications as of 05/20/2024  Medication Sig   albuterol (PROVENTIL HFA;VENTOLIN HFA) 108 (90 BASE) MCG/ACT inhaler Inhale into the lungs every 6 (six) hours as needed for wheezing or shortness of breath.   ibuprofen  (ADVIL ) 600 MG tablet Take 1 tablet (600 mg total)  by mouth every 8 (eight) hours as needed.   omeprazole  (PRILOSEC) 20 MG capsule Take 1 capsule (20 mg total) by mouth daily.   pravastatin  (PRAVACHOL ) 20 MG tablet Take 1 tablet (20 mg total) by mouth daily.   No facility-administered encounter medications on file as of 05/20/2024.    Allergies  Allergen Reactions   Shellfish Allergy Anaphylaxis     ROS: Review of Systems Pertinent items noted in HPI and remainder of comprehensive ROS otherwise negative.    Physical exam  [facility]  Warren Owens, is a 48 y.o. male  RDW:[id number]  FMW:[id number]  DOB - 1976/02/24  Chief Complaint  Patient presents with   Annual Exam   Abdominal Pain    Pt states when he press on the middle of his stomach he feels something and sometimes he doesn't  Pt states it has been going on for a few months        Subjective:   Warren Owens is a 48 y.o. male here today for an acute visit.  HPI  No problems updated.  Comprehensive ROS Pertinent positive and negative noted in HPI   Allergies  Allergen Reactions   Shellfish Allergy Anaphylaxis    Past Medical History:  Diagnosis Date   Hyperlipidemia     Current Outpatient Medications on File Prior to Visit  Medication Sig Dispense Refill   albuterol (PROVENTIL HFA;VENTOLIN HFA) 108 (90 BASE) MCG/ACT inhaler Inhale into the lungs every 6 (six) hours as needed for wheezing or shortness of breath.     ibuprofen  (ADVIL ) 600 MG tablet Take 1 tablet (600 mg total) by mouth every 8 (eight) hours as needed. 60 tablet 0   omeprazole  (PRILOSEC) 20 MG capsule Take 1 capsule (20 mg total) by mouth daily. 30 capsule 3   pravastatin  (PRAVACHOL ) 20 MG tablet Take 1 tablet (20 mg total) by mouth daily. 90 tablet 1   No current facility-administered medications on file prior to visit.   Health Maintenance  Topic Date Due   Hepatitis B Vaccine (1 of 3 - 19+ 3-dose series) Never done   Colon Cancer  Screening  Never done   COVID-19 Vaccine (3 - 2024-25 season) 07/12/2023   Flu Shot  06/10/2024   Lipid (cholesterol) test  08/02/2024   Stool Blood Test  08/03/2024   DTaP/Tdap/Td vaccine (4 - Td or Tdap) 09/10/2031   Hepatitis C Screening  Completed   HIV Screening  Completed   HPV Vaccine  Aged Out   Meningitis B Vaccine  Aged Out    Objective:   Vitals:   05/20/24 0858  BP: 125/83  Pulse: 74  Resp: 16  SpO2:  97%  Weight: 185 lb (83.9 kg)  Height: 5' 3 (1.6 m)   BP Readings from Last 3 Encounters:  05/20/24 125/83  08/03/23 129/87  03/31/23 122/84      Physical Exam  Physical exam: General: Vital signs reviewed.  Patient is well-developed and well-nourished, obese in no acute distress and cooperative with exam. Head: Normocephalic and atraumatic. Eyes: EOMI, conjunctivae normal, no scleral icterus. Neck: Supple, trachea midline, normal ROM, no JVD, masses, thyromegaly, or carotid bruit present. Cardiovascular: RRR, S1 normal, S2 normal, no murmurs, gallops, or rubs. Pulmonary/Chest: Clear to auscultation bilaterally, no wheezes, rales, or rhonchi. Abdominal: Soft, non-tender, non-distended, BS +, no masses, organomegaly, or guarding present. Musculoskeletal: No joint deformities, erythema, or stiffness, ROM full and nontender. Extremities: No lower extremity edema bilaterally,  pulses symmetric and intact bilaterally. No cyanosis or clubbing. Neurological: A&O x3, Strength is normal Skin: Warm, dry and intact. No rashes or erythema. Psychiatric: Normal mood and affect. speech and behavior is normal. Cognition and memory are normal.     Assessment & Plan   Warren Owens was seen today for annual exam and abdominal pain.  Diagnoses and all orders for this visit:  Annual physical exam -     CBC with Differential/Platelet -     CMP14+EGFR  Colon cancer screening -     Fecal occult blood, imunochemical; Future  Generalized abdominal pain -     sennosides-docusate  sodium (SENOKOT-S) 8.6-50 MG tablet; Take 1 tablet by mouth daily.  Other constipation -     sennosides-docusate sodium  (SENOKOT-S) 8.6-50 MG tablet; Take 1 tablet by mouth daily.  Dyslipidemia -     Lipid panel  Class 2 severe obesity due to excess calories with serious comorbidity in adult, unspecified BMI (HCC) -     Lipid panel  Prediabetes -     Hemoglobin A1c  Other orders -     Heplisav-B  (HepB-CPG) Vaccine  Gastroesophageal reflux disease with esophagitis without hemorrhage    Discussed eating small frequent meal, reduction in acidic foods, fried foods ,spicy foods, alcohol caffeine and tobacco and certain medications. Avoid laying down after eating 47mins-1hour, elevated head of the bed.   Patient have been counseled extensively about nutrition and exercise. Other issues discussed during this visit include: low cholesterol diet, weight control and daily exercise, foot care, annual eye examinations at Ophthalmology, importance of adherence with medications and regular follow-up. We also discussed long term complications of uncontrolled diabetes and hypertension.   No follow-ups on file.  The patient was given clear instructions to go to ER or return to medical center if symptoms don't improve, worsen or new problems develop. The patient verbalized understanding. The patient was told to call to get lab results if they haven't heard anything in the next week.   This note has been created with Education officer, environmental. Any transcriptional errors are unintentional.   Warren SHAUNNA Bohr, NP 05/20/2024, 9:18 AM    Assessment/ Plan: Warren Owens here for annual physical exam.   No problem-specific Assessment & Plan notes found for this encounter.   Counseled on healthy lifestyle choices, including diet (rich in fruits, vegetables and lean meats and low in salt and simple carbohydrates) and exercise (at least 30 minutes of  moderate physical activity daily).  Patient to follow up in 1 year for annual exam or sooner if needed.  The above assessment and management plan was discussed with the patient. The patient verbalized understanding of and has  agreed to the management plan. Patient is aware to call the clinic if symptoms persist or worsen. Patient is aware when to return to the clinic for a follow-up visit. Patient educated on when it is appropriate to go to the emergency department.   This note has been created with Education officer, environmental. Any transcriptional errors are unintentional.   Warren SHAUNNA Bohr, NP 05/20/2024, 9:16 AM

## 2024-05-20 NOTE — Patient Instructions (Addendum)
Obesidad en los adultos Obesity, Adult La obesidad es un exceso de Art gallery manager. Ser obeso significa que su peso es ms alto de lo que es saludable para usted.  El IMC (ndice de masa muscular) es un nmero que indica la cantidad de grasa corporal que tiene una persona. Si usted tiene un ndice de masa corporal (IMC) de 30 o ms, esto significa que es obeso. La obesidad puede causar problemas de salud graves, como los siguientes: Accidente cerebrovascular. Arteriopata coronaria (EAC). Diabetes tipo 2. Algunos tipos de cncer. Presin arterial alta (hipertensin arterial). Colesterol alto. Clculos en la vescula biliar. La obesidad tambin puede contribuir a lo siguiente: Artrosis. Apnea del sueo. Problemas de esterilidad. Cules son las causas? Consumir todos los [facility] con altos niveles de [place], International aid/development worker y [place]. Beber gran cantidad de bebidas con azcar. Nacer con genes que pueden hacerlo ms propenso a ser obeso. Tener una afeccin mdica que causa obesidad. Tomar ciertos medicamentos. Permanecer mucho tiempo sentado (tener un estilo de vida sedentario). No dormir lo suficiente. Qu incrementa el riesgo? Tener antecedentes familiares de obesidad. Vivir en un rea con acceso limitado a las siguientes posibilidades: Parques, centros recreativos o veredas. Alimentos saludables, como se venden en tiendas de comestibles y mercados de Event organiser. Cules son los signos o sntomas? El principal signo es tener demasiada grasa corporal. Cmo se trata? El tratamiento de esta afeccin frecuentemente incluye cambiar el estilo de vida. El tratamiento puede incluir: Cambios en la dieta. Esto puede incluir crear un plan de alimentacin saludable. Realizar actividad fsica. Puede incluir una actividad que hace que el corazn lata ms rpido (ejercicio [place]) y Fish farm manager de Pensions consultant. Trabaje con su mdico para disear un programa que funcione para usted. Medicamentos  para ayudarlo a Curator. Pueden utilizarse si no puede perder una libra por semana despus de 6 semanas de alimentacin saludable y ms ejercicio. Tratar las afecciones que causan la obesidad. [place]. Las opciones pueden incluir bandas gstricas y bypass gstrico. Esto puede realizarse en las siguientes situaciones: Otros tratamientos no mejoraron su afeccin. Tiene un IMC de 40 o superior. Tiene problemas de salud potencialmente mortales relacionados con la obesidad. Siga estas indicaciones en su casa: Comida y bebida  Siga las instrucciones del mdico respecto de las comidas y las bebidas. Su mdico puede recomendarle lo siguiente: Limitar las comidas rpidas, los dulces y las colaciones procesadas. Elegir opciones con bajo contenido de [place]. Por ejemplo, Counsellor de [employer]. Consumir cinco o ms porciones de frutas o verduras por da. Comer en casa con ms frecuencia. Esto le da ms control sobre lo que come. Elegir alimentos saludables cuando coma afuera. Aprender a leer las etiquetas de los alimentos. Esto le ayudar a aprender qu cantidad de alimento hay en una porcin. Tener a mano colaciones con bajo contenido de [place]. Evitar las bebidas que contengan mucha azcar. Estas incluyen refrescos, jugo de frutas, t helado con azcar y [place] saborizada. Beba suficiente agua para mantener el pis (la [place]) de color amarillo plido. No siga las dietas de [place]. Actividad fsica Haga ejercicios con frecuencia, como se lo haya indicado el mdico. La mayora de los adultos deben hacer hasta 150 minutos de ejercicio de intensidad moderada cada semana.Pregntele al mdico lo siguiente: Los tipos de ejercicios que son seguros para usted. La frecuencia con la que [employer] ejercicios. Precaliente y elongue adecuadamente antes de hacer actividad fsica. Haga un estiramiento lento despus de la actividad (relajacin). Descanse entre los perodos de  actividad. Estilo de vida Trabaje con su mdico y con un experto en alimentacin (nutricionista) para establecer un objetivo de prdida de peso que sea adecuado para usted. Limite el tiempo que pasa frente a una pantalla. Busque formas de recompensarse que no incluyan alimentos. No beba alcohol si: El mdico le indica que no lo haga. Est embarazada, puede estar embarazada o est tratando de [place]. Si bebe alcohol: Limite la cantidad que bebe a lo siguiente: De 0 a 1 medida por da para las mujeres. De 0 a 2 medidas por da para los hombres. Sepa cunta cantidad de alcohol hay en las bebidas que toma. En los [address], una medida equivale a una botella de cerveza de 12 oz (355 ml), un vaso de vino de 5 oz (148 ml) o un vaso de una bebida alcohlica de alta graduacin de 1 oz (44 ml). Indicaciones generales Lleve un diario de su prdida de peso. Esto puede ayudarlo a Youth worker de lo siguiente: Los alimentos que come. Cunto ejercicio realiza. Use los medicamentos de venta libre y los recetados solamente como se lo haya indicado el mdico. Tome vitaminas y suplementos solamente como se lo haya indicado el mdico. Considere participar en un grupo de apoyo. Preste atencin a Radiographer, therapeutic mental, ya que la obesidad puede provocar depresin o problemas de [place]. Concurra a todas las visitas de seguimiento. Comunquese con un mdico si: No puede alcanzar su objetivo de prdida de peso despus de haber modificado su dieta y su estilo de vida durante 6 semanas. Presenta dificultades respiratorias sbitas. Resumen La obesidad es un exceso de Art gallery manager. Ser obeso significa que su peso es ms alto de lo que es saludable para usted. Trabaje con su mdico para establecer un objetivo de prdida de [place]. Haga actividad fsica con regularidad tal como le indic el mdico. Esta informacin no tiene Theme park manager el consejo del mdico. Asegrese de hacerle al  mdico cualquier pregunta que tenga. Document Revised: 06/27/2021 Document Reviewed: 06/27/2021 Elsevier Patient Education  2024 [employer].

## 2024-05-21 LAB — CBC WITH DIFFERENTIAL/PLATELET
Basophils Absolute: 0 x10E3/uL (ref 0.0–0.2)
Basos: 1 %
EOS (ABSOLUTE): 0.2 x10E3/uL (ref 0.0–0.4)
Eos: 2 %
Hematocrit: 48 % (ref 37.5–51.0)
Hemoglobin: 16.2 g/dL (ref 13.0–17.7)
Immature Grans (Abs): 0 x10E3/uL (ref 0.0–0.1)
Immature Granulocytes: 0 %
Lymphocytes Absolute: 2.4 x10E3/uL (ref 0.7–3.1)
Lymphs: 29 %
MCH: 31 pg (ref 26.6–33.0)
MCHC: 33.8 g/dL (ref 31.5–35.7)
MCV: 92 fL (ref 79–97)
Monocytes Absolute: 0.5 x10E3/uL (ref 0.1–0.9)
Monocytes: 6 %
Neutrophils Absolute: 5.2 x10E3/uL (ref 1.4–7.0)
Neutrophils: 62 %
Platelets: 287 x10E3/uL (ref 150–450)
RBC: 5.23 x10E6/uL (ref 4.14–5.80)
RDW: 12.8 % (ref 11.6–15.4)
WBC: 8.4 x10E3/uL (ref 3.4–10.8)

## 2024-05-21 LAB — CMP14+EGFR
ALT: 30 IU/L (ref 0–44)
AST: 25 IU/L (ref 0–40)
Albumin: 3.9 g/dL — ABNORMAL LOW (ref 4.1–5.1)
Alkaline Phosphatase: 119 IU/L (ref 44–121)
BUN/Creatinine Ratio: 15 (ref 9–20)
BUN: 13 mg/dL (ref 6–24)
Bilirubin Total: 0.4 mg/dL (ref 0.0–1.2)
CO2: 20 mmol/L (ref 20–29)
Calcium: 9 mg/dL (ref 8.7–10.2)
Chloride: 104 mmol/L (ref 96–106)
Creatinine, Ser: 0.88 mg/dL (ref 0.76–1.27)
Globulin, Total: 3 g/dL (ref 1.5–4.5)
Glucose: 91 mg/dL (ref 70–99)
Potassium: 4.2 mmol/L (ref 3.5–5.2)
Sodium: 138 mmol/L (ref 134–144)
Total Protein: 6.9 g/dL (ref 6.0–8.5)
eGFR: 106 mL/min/1.73 (ref >59)

## 2024-05-21 LAB — LIPID PANEL
Chol/HDL Ratio: 4.7 ratio (ref 0.0–5.0)
Cholesterol, Total: 196 mg/dL (ref 100–199)
HDL: 42 mg/dL (ref >39)
LDL Chol Calc (NIH): 130 mg/dL — ABNORMAL HIGH (ref 0–99)
Triglycerides: 132 mg/dL (ref 0–149)
VLDL Cholesterol Cal: 24 mg/dL (ref 5–40)

## 2024-05-21 LAB — HEMOGLOBIN A1C
Est. average glucose Bld gHb Est-mCnc: 114 mg/dL
Hgb A1c MFr Bld: 5.6 % (ref 4.8–5.6)

## 2024-05-23 ENCOUNTER — Other Ambulatory Visit: Payer: Self-pay

## 2024-05-23 ENCOUNTER — Ambulatory Visit: Payer: Self-pay | Admitting: Primary Care

## 2024-05-23 MED ORDER — PRAVASTATIN SODIUM 20 MG PO TABS
20.0000 mg | ORAL_TABLET | Freq: Every day | ORAL | 1 refills | Status: AC
  Filled 2024-05-23: qty 90, 90d supply, fill #0
  Filled 2024-08-24 (×2): qty 90, 90d supply, fill #1

## 2024-05-25 ENCOUNTER — Other Ambulatory Visit: Payer: Self-pay

## 2024-06-20 ENCOUNTER — Encounter (INDEPENDENT_AMBULATORY_CARE_PROVIDER_SITE_OTHER): Payer: Self-pay

## 2024-06-20 ENCOUNTER — Other Ambulatory Visit: Payer: Self-pay

## 2024-06-20 ENCOUNTER — Ambulatory Visit (INDEPENDENT_AMBULATORY_CARE_PROVIDER_SITE_OTHER): Payer: Self-pay | Admitting: Primary Care

## 2024-06-20 VITALS — BP 127/88 | HR 76 | Wt 186.4 lb

## 2024-06-20 DIAGNOSIS — Z1211 Encounter for screening for malignant neoplasm of colon: Secondary | ICD-10-CM

## 2024-06-20 DIAGNOSIS — Z23 Encounter for immunization: Secondary | ICD-10-CM

## 2024-06-20 DIAGNOSIS — K219 Gastro-esophageal reflux disease without esophagitis: Secondary | ICD-10-CM

## 2024-06-20 DIAGNOSIS — I517 Cardiomegaly: Secondary | ICD-10-CM

## 2024-06-20 DIAGNOSIS — M940 Chondrocostal junction syndrome [Tietze]: Secondary | ICD-10-CM

## 2024-06-20 MED ORDER — LISINOPRIL 5 MG PO TABS
5.0000 mg | ORAL_TABLET | Freq: Every day | ORAL | 0 refills | Status: AC
  Filled 2024-06-20: qty 90, 90d supply, fill #0

## 2024-06-20 MED ORDER — IBUPROFEN 600 MG PO TABS
600.0000 mg | ORAL_TABLET | Freq: Three times a day (TID) | ORAL | 1 refills | Status: AC | PRN
  Filled 2024-06-20: qty 90, 30d supply, fill #0

## 2024-06-20 NOTE — Progress Notes (Signed)
 [facility]   Warren Owens is a 48 y.o. male acute visit patient experiencing chest pain he denies shortness of breath, headaches,  or lower extremity edema, sudden onset, vision changes, unilateral weakness, dizziness, paresthesias.  Discussed with symptoms more from muscle skeletal called costochondritis explained the difference between muscle skeletal pain and a MI inform patient if chest pain became very heavy shortness of breath radiates up his left side of neck and down arm can be a headache or not back pain or not to ask for call 911 follow-up with PCP afterwards.   Past Medical History:  Diagnosis Date   Hyperlipidemia    No past surgical history on file. Allergies  Allergen Reactions   Shellfish Allergy Anaphylaxis   Current Outpatient Medications on File Prior to Visit  Medication Sig Dispense Refill   albuterol (PROVENTIL HFA;VENTOLIN HFA) 108 (90 BASE) MCG/ACT inhaler Inhale into the lungs every 6 (six) hours as needed for wheezing or shortness of breath.     ibuprofen  (ADVIL ) 600 MG tablet Take 1 tablet (600 mg total) by mouth every 8 (eight) hours as needed. 60 tablet 0   omeprazole  (PRILOSEC) 20 MG capsule Take 1 capsule (20 mg total) by mouth daily. 30 capsule 3   pravastatin  (PRAVACHOL ) 20 MG tablet Take 1 tablet (20 mg total) by mouth daily. 90 tablet 1   sennosides-docusate sodium  (SENOKOT-S) 8.6-50 MG tablet Take 1 tablet by mouth daily. 90 tablet 1   No current facility-administered medications on file prior to visit.   Social History   Socioeconomic History   Marital status: Married    Spouse name: Not on file   Number of children: Not on file   Years of education: Not on file   Highest education level: Not on file  Occupational History   Not on file  Tobacco Use   Smoking status: Never   Smokeless tobacco: Never  Vaping Use   Vaping status: Never Used  Substance and Sexual Activity   Alcohol use: No   Drug use: No    Sexual activity: Yes  Other Topics Concern   Not on file  Social History Narrative   Not on file   Social Drivers of Health   Financial Resource Strain: Low Risk  (08/03/2023)   Overall Financial Resource Strain (CARDIA)    Difficulty of Paying Living Expenses: Not hard at all  Food Insecurity: No Food Insecurity (05/20/2024)   Hunger Vital Sign    Worried About Running Out of Food in the Last Year: Never true    Ran Out of Food in the Last Year: Never true  Transportation Needs: No Transportation Needs (05/20/2024)   PRAPARE - Administrator, Civil Service (Medical): No    Lack of Transportation (Non-Medical): No  Physical Activity: Sufficiently Active (08/03/2023)   Exercise Vital Sign    Days of Exercise per Week: 7 days    Minutes of Exercise per Session: 30 min  Stress: No Stress Concern Present (08/03/2023)   [employer] of Occupational Health - Occupational Stress Questionnaire    Feeling of Stress : Not at all  Social Connections: Socially Integrated (08/03/2023)   Social Connection and Isolation Panel    Frequency of Communication with Friends and Family: More than three times a week    Frequency of Social Gatherings with Friends and Family: More than three times a week    Attends Religious Services: More than 4 times per year    Active Member of  Clubs or Organizations: No    Attends Banker Meetings: 1 to 4 times per year    Marital Status: Married  Catering manager Violence: Not At Risk (05/20/2024)   Humiliation, Afraid, Rape, and Kick questionnaire    Fear of Current or Ex-Partner: No    Emotionally Abused: No    Physically Abused: No    Sexually Abused: No   Family History  Problem Relation Age of Onset   Hypertension Mother    Diabetes Father    Health Maintenance  Topic Date Due   Colonoscopy  Never done   COVID-19 Vaccine (3 - 2024-25 season) 07/12/2023   INFLUENZA VACCINE  06/10/2024   COLON CANCER SCREENING ANNUAL FOBT   08/03/2024   LIPID PANEL  05/20/2025   DTaP/Tdap/Td (4 - Td or Tdap) 09/10/2031   Hepatitis B Vaccines  Completed   Hepatitis C Screening  Completed   HIV Screening  Completed   HPV VACCINES  Aged Out   Meningococcal B Vaccine  Aged Out     OBJECTIVE:  Vitals:   06/20/24 0918  BP: 127/88  Pulse: 76  SpO2: 95%  Weight: 186 lb 6.4 oz (84.6 kg)    Physical Exam General: No apparent distress. Eyes: Extraocular eye movements intact, pupils equal and round. Neck: Supple, trachea midline. Thyroid: No enlargement, mobile without fixation, no tenderness. Cardiovascular: Costochondritis-reciprocated pain regular rhythm and rate, no murmur, normal radial pulses. Respiratory: Normal respiratory effort, clear to auscultation. Gastrointestinal: Normal pitch active bowel sounds, nontender abdomen without distention or appreciable hepatomegaly. Musculoskeletal: Normal muscle tone,  Skin: Appropriate warmth, no visible rash. Mental status: Alert, conversant, speech clear, thought logical, appropriate mood and affect, no hallucinations or delusions evident. Hematologic/lymphatic: No cervical adenopathy, no visible ecchymoses.   ROS Comprehensive ROS Pertinent positive and negative noted in HPI   Last 3 Office BP readings: BP Readings from Last 3 Encounters:  06/20/24 127/88  05/20/24 125/83  08/03/23 129/87    BMET    Component Value Date/Time   NA 138 05/20/2024 0931   K 4.2 05/20/2024 0931   CL 104 05/20/2024 0931   CO2 20 05/20/2024 0931   GLUCOSE 91 05/20/2024 0931   GLUCOSE 97 04/17/2022 1801   BUN 13 05/20/2024 0931   CREATININE 0.88 05/20/2024 0931   CREATININE 0.88 07/18/2014 0842   CALCIUM 9.0 05/20/2024 0931   GFRNONAA >60 04/17/2022 1801   GFRNONAA >89 07/18/2014 0842   GFRAA >89 07/18/2014 0842    Renal function: CrCl cannot be calculated (Patient's most recent lab result is older than the maximum 21 days allowed.).  Clinical ASCVD: No  The 10-year ASCVD  risk score (Arnett DK, et al., 2019) is: 3.3%   Values used to calculate the score:     Age: 89 years     Clincally relevant sex: Male     Is Non-Hispanic African American: No     Diabetic: No     Tobacco smoker: No     Systolic Blood Pressure: 127 mmHg     Is BP treated: No     HDL Cholesterol: 42 mg/dL     Total Cholesterol: 196 mg/dL  ASCVD risk factors include- [place]   ASSESSMENT & PLAN: Kaiel was seen today for chest pain.  Diagnoses and all orders for this visit:  Costochondritis -     ibuprofen  (ADVIL ) 600 MG tablet; Take 1 tablet (600 mg total) by mouth every 8 (eight) hours as needed for moderate pain (pain score 4-6).  Mild concentric left ventricular hypertrophy (LVH) -     lisinopril  (ZESTRIL ) 5 MG tablet; Take 1 tablet (5 mg total) by mouth daily.  Gastroesophageal reflux disease without esophagitis Discussed eating small frequent meal, reduction in acidic foods, fried foods ,spicy foods, alcohol caffeine and tobacco and certain medications. Avoid laying down after eating 63mins-1hour, elevated head of the bed.   Other orders -     Heplisav-B  (HepB-CPG) Vaccine     Rosaline SHAUNNA Bohr, NP 06/20/2024, 9:34 AM

## 2024-06-20 NOTE — Progress Notes (Signed)
 DOB was confirmed  and allergy list was also confirmed   Patient tolerated shot well   Pt is on time for dose

## 2024-06-20 NOTE — Patient Instructions (Signed)
 Costocondritis Costochondritis  La costocondritis es la irritacin e hinchazn (inflamacin) del tejido que une las costillas con el esternn. Este tejido se Consulting civil engineer. Esta afeccin causa dolor en la parte frontal del pecho. El dolor suele comenzar lentamente. Puede estar presente en ms de una costilla. Cules son las causas? No siempre se conoce la causa de esta afeccin. Puede deberse a sobrecarga en el esternn. La causa de esta sobrecarga puede ser la siguiente: Lesin torcica. Ejercicio o actividad. Esto puede Dealer. Tos muy intensa. Qu incrementa el riesgo? Ser mujer. Tener entre 30 y 40 aos. Comenzar un nuevo ejercicio o una nueva actividad laboral. Wilburt Finlay niveles bajos de vitamina D. Tener una afeccin que le haga toser [place]. Cules son los signos o sntomas? Dolor en el pecho con las siguientes caractersticas: Comienza lentamente. Puede ser agudo o sordo. Empeora al respirar, toser o hacer ejercicio. Mejora con el reposo. Puede empeorar al presionar las costillas y el esternn. Cmo se trata? En la [employer], [place] afeccin desaparece sola con el Dime Box. Es posible que deba tomar antiinflamatorios no esteroideos (AINE), como ibuprofeno. Esto puede ayudar a Glass blower/designer. Podra tambin necesitar: Hacer reposo y [employer] de las actividades que empeoren Chief Technology Officer. Aplicar calor o hielo en la zona que duele. Hacer ejercicios para elongar los msculos del trax. Si estos tratamientos no ayudan, el mdico puede inyectarle un medicamento para adormecer la zona. Esto puede ayudar a Engineer, materials. Siga estas instrucciones en su casa: Control del dolor, la rigidez y la hinchazn     Si se lo indican, aplique hielo sobre la zona dolorida. Para hacer esto: Ponga el hielo en una bolsa plstica. Coloque una toalla entre la piel y Copy. Aplique el hielo durante 20 minutos, 2 a 3 veces por da. Si se lo indican, aplique calor  en la zona afectada. Hgalo con la frecuencia que le haya indicado el mdico. Use la fuente de calor que el mdico le recomiende, como una compresa de calor hmedo o una almohadilla trmica. Coloque una toalla entre la piel y la fuente de Airline pilot. Aplique calor durante 20 a 30 minutos. Si la piel se le pone de color rojo brillante, quite el hielo o el calor de inmediato para evitar daos en la piel. El riesgo de dao en la piel es mayor si no puede sentir dolor, calor o fro. Actividad Haga reposo como se lo haya indicado el mdico. No haga cosas que Warden/ranger. Esto incluye cualquier [employer] del trax, el vientre (abdomen) y los laterales. Es posible que Personnel officer objetos. Pregntele a su mdico cunto Database administrator con seguridad. Retome sus actividades normales cuando el mdico le diga que es seguro. Instrucciones generales [employer] de venta libre y los recetados solamente como se lo haya indicado el mdico. Comunquese con un mdico si: Siente escalofros o tiene fiebre. El dolor no desaparece o empeora. Tiene tos que no desaparece. Solicite ayuda de inmediato si: Tiene dificultad para respirar. Tiene dolor muy intenso en el trax que no mejora con medicamentos, con hielo o con calor. Estos sntomas pueden Customer service manager. Solicite ayuda de inmediato. Llame al 911. No espere a ver si los sntomas desaparecen. No conduzca por sus propios medios [employer]. Esta informacin no tiene Theme park manager el consejo del mdico. Asegrese de hacerle al mdico cualquier pregunta que tenga. Document Revised: 06/07/2022 Document Reviewed: 06/07/2022 Elsevier Patient Education  2024 Elsevier Inc.

## 2024-06-22 LAB — SPECIMEN STATUS REPORT

## 2024-06-22 LAB — FECAL OCCULT BLOOD, IMMUNOCHEMICAL: Fecal Occult Bld: NEGATIVE

## 2024-07-01 ENCOUNTER — Encounter (INDEPENDENT_AMBULATORY_CARE_PROVIDER_SITE_OTHER): Payer: Self-pay | Admitting: Primary Care

## 2024-07-12 ENCOUNTER — Encounter (INDEPENDENT_AMBULATORY_CARE_PROVIDER_SITE_OTHER): Payer: Self-pay | Admitting: Primary Care

## 2024-08-02 ENCOUNTER — Encounter (INDEPENDENT_AMBULATORY_CARE_PROVIDER_SITE_OTHER): Payer: Self-pay | Admitting: Primary Care

## 2024-08-24 ENCOUNTER — Other Ambulatory Visit: Payer: Self-pay

## 2024-08-25 ENCOUNTER — Other Ambulatory Visit: Payer: Self-pay
